# Patient Record
Sex: Female | Born: 1940
Health system: Southern US, Community
[De-identification: ages and names within clinical notes are randomized; demographics above are authoritative.]

## PROBLEM LIST (undated history)

## (undated) DIAGNOSIS — M1990 Unspecified osteoarthritis, unspecified site: Secondary | ICD-10-CM

## (undated) DIAGNOSIS — R5381 Other malaise: Secondary | ICD-10-CM

## (undated) DIAGNOSIS — J4489 Other specified chronic obstructive pulmonary disease: Secondary | ICD-10-CM

## (undated) DIAGNOSIS — I10 Essential (primary) hypertension: Secondary | ICD-10-CM

## (undated) DIAGNOSIS — R0602 Shortness of breath: Secondary | ICD-10-CM

## (undated) DIAGNOSIS — J439 Emphysema, unspecified: Secondary | ICD-10-CM

## (undated) DIAGNOSIS — J449 Chronic obstructive pulmonary disease, unspecified: Secondary | ICD-10-CM

## (undated) DIAGNOSIS — I200 Unstable angina: Secondary | ICD-10-CM

## (undated) DIAGNOSIS — F17200 Nicotine dependence, unspecified, uncomplicated: Secondary | ICD-10-CM

## (undated) DIAGNOSIS — R5383 Other fatigue: Secondary | ICD-10-CM

## (undated) DIAGNOSIS — E039 Hypothyroidism, unspecified: Secondary | ICD-10-CM

## (undated) DIAGNOSIS — I519 Heart disease, unspecified: Secondary | ICD-10-CM

## (undated) DIAGNOSIS — I739 Peripheral vascular disease, unspecified: Secondary | ICD-10-CM

## (undated) DIAGNOSIS — I2510 Atherosclerotic heart disease of native coronary artery without angina pectoris: Secondary | ICD-10-CM

## (undated) DIAGNOSIS — F411 Generalized anxiety disorder: Secondary | ICD-10-CM

## (undated) DIAGNOSIS — E785 Hyperlipidemia, unspecified: Secondary | ICD-10-CM

## (undated) DIAGNOSIS — R079 Chest pain, unspecified: Secondary | ICD-10-CM

## (undated) DIAGNOSIS — R002 Palpitations: Secondary | ICD-10-CM

## (undated) HISTORY — DX: Generalized anxiety disorder: F41.1

## (undated) HISTORY — DX: Peripheral vascular disease, unspecified: I73.9

## (undated) HISTORY — DX: Unstable angina: I20.0

## (undated) HISTORY — DX: Hyperlipidemia, unspecified: E78.5

## (undated) HISTORY — DX: Atherosclerotic heart disease of native coronary artery without angina pectoris: I25.10

## (undated) HISTORY — DX: Shortness of breath: R06.02

## (undated) HISTORY — DX: Nicotine dependence, unspecified, uncomplicated: F17.200

## (undated) HISTORY — DX: Other fatigue: R53.83

## (undated) HISTORY — PX: APPENDECTOMY: SHX54

## (undated) HISTORY — DX: Other specified chronic obstructive pulmonary disease: J44.89

## (undated) HISTORY — PX: ABDOMINAL HYSTERECTOMY: SHX81

## (undated) HISTORY — DX: Chest pain, unspecified: R07.9

## (undated) HISTORY — DX: Other malaise: R53.81

## (undated) HISTORY — DX: Palpitations: R00.2

## (undated) HISTORY — DX: Chronic obstructive pulmonary disease, unspecified: J44.9

## (undated) HISTORY — DX: Essential (primary) hypertension: I10

## (undated) HISTORY — DX: Emphysema, unspecified: J43.9

## (undated) HISTORY — DX: Heart disease, unspecified: I51.9

---

## 2006-02-11 DIAGNOSIS — I2510 Atherosclerotic heart disease of native coronary artery without angina pectoris: Secondary | ICD-10-CM

## 2006-02-11 HISTORY — DX: Atherosclerotic heart disease of native coronary artery without angina pectoris: I25.10

## 2006-09-30 ENCOUNTER — Encounter: Payer: Self-pay | Admitting: Physician Assistant

## 2006-09-30 ENCOUNTER — Ambulatory Visit: Payer: Self-pay | Admitting: Cardiology

## 2006-10-01 ENCOUNTER — Inpatient Hospital Stay (HOSPITAL_COMMUNITY): Admission: AD | Admit: 2006-10-01 | Discharge: 2006-10-03 | Payer: Self-pay | Admitting: Internal Medicine

## 2006-10-01 ENCOUNTER — Ambulatory Visit: Payer: Self-pay | Admitting: Cardiology

## 2006-10-02 ENCOUNTER — Encounter: Payer: Self-pay | Admitting: Cardiology

## 2006-10-20 ENCOUNTER — Ambulatory Visit: Payer: Self-pay | Admitting: Physician Assistant

## 2007-12-10 ENCOUNTER — Ambulatory Visit (HOSPITAL_COMMUNITY): Admission: RE | Admit: 2007-12-10 | Discharge: 2007-12-10 | Payer: Self-pay | Admitting: Otolaryngology

## 2009-11-21 ENCOUNTER — Encounter (INDEPENDENT_AMBULATORY_CARE_PROVIDER_SITE_OTHER): Payer: Self-pay | Admitting: *Deleted

## 2009-11-21 ENCOUNTER — Ambulatory Visit: Payer: Self-pay | Admitting: Cardiology

## 2009-11-21 DIAGNOSIS — E785 Hyperlipidemia, unspecified: Secondary | ICD-10-CM

## 2009-11-21 DIAGNOSIS — I739 Peripheral vascular disease, unspecified: Secondary | ICD-10-CM | POA: Insufficient documentation

## 2009-11-21 DIAGNOSIS — R079 Chest pain, unspecified: Secondary | ICD-10-CM | POA: Insufficient documentation

## 2009-11-21 DIAGNOSIS — F17200 Nicotine dependence, unspecified, uncomplicated: Secondary | ICD-10-CM

## 2009-11-23 ENCOUNTER — Encounter: Payer: Self-pay | Admitting: Cardiology

## 2009-11-24 ENCOUNTER — Inpatient Hospital Stay (HOSPITAL_BASED_OUTPATIENT_CLINIC_OR_DEPARTMENT_OTHER): Admission: RE | Admit: 2009-11-24 | Discharge: 2009-11-24 | Payer: Self-pay | Admitting: Cardiology

## 2009-11-24 ENCOUNTER — Ambulatory Visit: Payer: Self-pay | Admitting: Cardiology

## 2009-11-29 ENCOUNTER — Encounter (INDEPENDENT_AMBULATORY_CARE_PROVIDER_SITE_OTHER): Payer: Self-pay | Admitting: *Deleted

## 2009-12-06 ENCOUNTER — Ambulatory Visit: Payer: Self-pay | Admitting: Cardiology

## 2009-12-06 DIAGNOSIS — J069 Acute upper respiratory infection, unspecified: Secondary | ICD-10-CM | POA: Insufficient documentation

## 2009-12-15 ENCOUNTER — Encounter: Payer: Self-pay | Admitting: Cardiology

## 2009-12-19 ENCOUNTER — Encounter (INDEPENDENT_AMBULATORY_CARE_PROVIDER_SITE_OTHER): Payer: Self-pay | Admitting: *Deleted

## 2010-03-13 NOTE — Letter (Signed)
Summary: Engineer, materials at [facility]  [address]   [place], [place] [address]   Phone: [phone]  Fax: [phone]        December 19, 2009 MRN: [id number]   SHARMON CHERAMIE [address] [place], [place]  [address]   Dear Ms. Ethelle Lyon,  Your test ordered by Selena Batten has been reviewed by your physician (or physician assistant) and was found to be normal or stable. Your physician (or physician assistant) felt no changes were needed at this time.  ____ Echocardiogram  ____ Cardiac Stress Test  ____ Lab Work  __X__ Peripheral vascular study of arms, legs or neck  ____ CT scan or X-ray  ____ Lung or Breathing test  ____ Other:   Thank you.   Hoover Brunette, LPN    Duane Boston, M.D., F.A.C.C. Thressa Sheller, M.D., F.A.C.C. Oneal Grout, M.D., F.A.C.C. Cheree Ditto, M.D., F.A.C.C. Daiva Nakayama, M.D., F.A.C.C. Kenney Houseman, M.D., F.A.C.C. Jeanne Ivan, PA-C

## 2010-03-13 NOTE — Cardiovascular Report (Signed)
Summary: Cardiac Cath Dr. Riley Kill  Cardiac Cath Dr. Riley Kill   Imported By: Zachary George 11/21/2009 13:19:18  _____________________________________________________________________  External Attachment:    Type:   Image     Comment:   External Document

## 2010-03-13 NOTE — Assessment & Plan Note (Signed)
Summary: OV per anwar - poss cath  --agh   Visit Type:  Initial Consult Primary Provider:  Linna Darner  CC:  chest pain.  History of Present Illness: the patient is a 70 year old female with history of nonobstructive coronary artery disease last catheterization in 2008 by Dr. Riley Kill. The patient has multiple risk factors and tobacco use, hypertension dyslipidemia and family history of coronary artery disease.  The patient has been referred by Dr. Linna Darner for new-onset exertional chest pain. The patient states over the last several weeks she has experienced heaviness in the chest as well as heaviness in both arms particularly walking up hill. She also develops calf pain upon walking with a sensation of tightness necessitating to stop her activities. She also symptoms of reflux or heartburn is distinct and different from her current symptoms. Associated with chest heaviness for the last several minutes there is short of breath. She denies any palpitations. She was given Imdur by her primary care physician with moderate improvement in her symptoms. She also spironolactone which has not used. There was concern due to last office visit in 2008 at the patient had peripheral vascular disease but did not get an aortogram of time for cardiac catheterization. Unfortunately also the patient used to smoke but she has been compliant with her other medical regimen. Her EKG today shows no acute ischemic changes. There is an old septal infarct pattern but this is noted on prior electrocardiograms.  The patient has been referred by primary care physician and a question of possible need for cardiac catheterization.  Preventive Screening-Counseling & Management  Alcohol-Tobacco     Smoking Status: current     Smoking Cessation Counseling: yes     Packs/Day: 1/2 PPD  Current Medications (verified): 1)  Coq-10 100 Mg Caps (Coenzyme Q10) .... Take 1 Tablet By Mouth Once A Day 2)  Aspirin 325 Mg Tabs (Aspirin) .... Take 1  Tablet By Mouth Once A Day 3)  Zyrtec Allergy 10 Mg Tabs (Cetirizine Hcl) .... Take 1 Tablet By Mouth Once A Day 4)  Metoprolol Succinate 50 Mg Xr24h-Tab (Metoprolol Succinate) .... Take 1 Tablet By Mouth Once A Day 5)  Caltrate 600+d 600-400 Mg-Unit Tabs (Calcium Carbonate-Vitamin D) .... Take 2 Tablet By Mouth Once A Day 6)  Vitamin D3 1000 Unit Caps (Cholecalciferol) .... Take 1 Tablet By Mouth Once A Day 7)  Imdur 60 Mg Xr24h-Tab (Isosorbide Mononitrate) .... Take 1 Tablet By Mouth Once A Day 8)  Trilipix 135 Mg Cpdr (Choline Fenofibrate) .... Take 1 Tablet By Mouth Once A Day 9)  Red Yeast Rice 600 Mg Caps (Red Yeast Rice Extract) .... Take 2 Tablet By Mouth Once A Day  Allergies (verified): 1)  ! * Celdene  Comments:  Nurse/Medical Assistant: The patient's medication list and allergies were reviewed with the patient and were updated in the Medication and Allergy Lists.  Past History:  Past Medical History:  1. Nonobstructive coronary artery disease byby cardiac catheterization in 2008 2. Good left ventricular function. 3. Dyslipidemia. 4. Hypertension. 5. Ex-smoker. a. 30-pack-year history. 6. Palpitations. 7. Left lower extremity exertional pain. a. Rule out claudication. 8. Family history of coronary artery disease. 9. Chest pain.   Past Surgical History: appendectomy  Family History: Reviewed history and no changes required.  sister age 79 status post myocardial infarction at age 30  Social History: Reviewed history and no changes required. patient lives in [place] she is the mother Efrain Sella, who used to work in our office. Chest children  and 10 grandchildren. She is retired from [employer]. Shouldn't smoking tobacco since age 73. Denies alcohol use.Smoking Status:  current Packs/Day:  1/2 PPD  Review of Systems       The patient complains of chest pain and shortness of breath.  The patient denies fatigue, malaise, fever, weight gain/loss, vision loss, decreased  hearing, hoarseness, palpitations, prolonged cough, wheezing, sleep apnea, coughing up blood, abdominal pain, blood in stool, nausea, vomiting, diarrhea, heartburn, incontinence, blood in urine, muscle weakness, joint pain, leg swelling, rash, skin lesions, headache, fainting, dizziness, depression, anxiety, enlarged lymph nodes, easy bruising or bleeding, and environmental allergies.    Vital Signs:  Patient profile:   69 year old female Height:      60 inches Weight:      135 pounds BMI:     26.46 Pulse rate:   79 / minute BP sitting:   160 / 89  (left arm) Cuff size:   regular  Vitals Entered By: Carlye Grippe (November 21, 2009 1:20 PM)  Nutrition Counseling: Patient's BMI is greater than 25 and therefore counseled on weight management options. CC: chest pain   Physical Exam  Additional Exam:  General: Well-developed, well-nourished in no distress head: Normocephalic and atraumatic eyes PERRLA/EOMI intact, conjunctiva and lids normal nose: No deformity or lesions mouth normal dentition, normal posterior pharynx neck: Supple, no JVD.  No masses, thyromegaly or abnormal cervical nodes lungs: Normal breath sounds bilaterally without wheezing.  Normal percussion heart: regular rate and rhythm with normal S1 and S2, no S3 or S4.  PMI is normal.  No pathological murmurs abdomen: Normal bowel sounds, abdomen is soft and nontender without masses, organomegaly or hernias noted.  No hepatosplenomegaly. Right lower quadrant scar is noted from prior appendectomy musculoskeletal: Back normal, normal gait muscle strength and tone normal pulsus: femoral pulses are 1+ bilaterally with bilateral bruits dorsalis pedis and posterior tibial pulses are 2+ bilaterally Extremities: No peripheral pitting edema neurologic: Alert and oriented x 3 skin: Intact without lesions or rashes cervical nodes: No significant adenopathy psychologic: Normal affect    EKG  Procedure date:   11/21/2009  Findings:      normal sinus rhythm septal infarct pattern age undetermined. Nonspecific ST-T wave changes. Heart rate 85 beats per minute.  Impression & Recommendations:  Problem # 1:  CHEST PAIN (ICD-786.50) the patient's chest pain is concerning for angina. Although she had nonobstructive disease in 2008 she has ongoing untreated risk factors. In particularlyshe continues to smoke heavily. We will increase isosorbide mononitrate 60 mg a day to schedule her for a diagnostic catheterization later this week.I discussed with the patient the risk and benefits of diagnostic cardiac catheterization as well as a distal aortogram she is willing to proceed. She denies any dye allergies and had no prior complications with her catheterization. Her updated medication list for this problem includes:    Aspirin 325 Mg Tabs (Aspirin) .Marland Kitchen... Take 1 tablet by mouth once a day    Metoprolol Succinate 50 Mg Xr24h-tab (Metoprolol succinate) .Marland Kitchen... Take 1 tablet by mouth once a day    Imdur 60 Mg Xr24h-tab (Isosorbide mononitrate) .Marland Kitchen... Take 1 tablet by mouth once a day  Orders: EKG w/ Interpretation (93000) T-Basic Metabolic Panel ([id number]) T-CBC No Diff ([id number]) T-Protime, Auto ([id number]) T-PTT ([id number]) T-Chest x-ray, 2 views (28413) Cardiac Catheterization (Cardiac Cath)  Problem # 2:  CLAUDICATION (ICD-443.9) the patient's symptoms are concerning for claudication however she has good palpable pulses in the lower extremities but she does  have bilateral femoral bruits. She can be screened for iliofemoral disease during her cardiac catheterization. If negative I doubt ABIs are needed as her posterior tibial pulses and dorsalis pedis pulse are quite palpable.  Problem # 3:  TOBACCO ABUSE (ICD-305.1) the patient did start Nicoderm after her cardiac catheterization which he already has purchased and she is now more motivated to discontinue tobacco  Problem # 4:  OTHER AND  UNSPECIFIED HYPERLIPIDEMIA (ICD-272.4)  the patient has hypertriglyceridemia and also will need a lipid panel and could benefit from statin drug therapy. Her updated medication list for this problem includes:    Trilipix 135 Mg Cpdr (Choline fenofibrate) .Marland Kitchen... Take 1 tablet by mouth once a day  Orders: T-Basic Metabolic Panel [id number]) T-CBC No Diff ([id number]) T-Protime, Auto ([id number]) T-PTT ([id number]) T-Chest x-ray, 2 views (28413)  Patient Instructions: 1)  Left JV Cath 2)  Increase Imdur to 60mg  daily 3)  Follow up in  1 month

## 2010-03-13 NOTE — Letter (Signed)
Summary: Cardiac Cath Instructions - [facility]  [facility] [address]   [place], [place] [address]   Phone: [phone]  Fax: [phone]     11/21/2009 MRN: [id number]  Linda Sherman [address] [place], [place]  [address]  Dear [name],   You are scheduled for a Cardiac Catheterization on Friday, October 14 at 10:30 with Dr. Antoine Poche.  Please arrive to the 1st floor of the Heart and Vascular Center at [facility] at 9:30 am on the day of your procedure. Please do not arrive before 6:30 a.m. Call the Heart and Vascular Center at [phone] if you are unable to make your appointmnet. The Code to get into the parking garage under the building is 0200. Take the elevators to the 1st floor. You must have someone to drive you home. Someone must be with you for the first 24 hours after you arrive home. Please wear clothes that are easy to get on and off and wear slip-on shoes. Do not eat or drink after midnight except water with your medications that morning. Bring all your medications and current insurance cards with you.  ___ DO NOT take these medications before your procedure:  _X__ Make sure you take your aspirin.  _X__ You may take ALL of your medications with water that morning.  ___ DO NOT take ANY medications before your procedure.  ___ Pre-med instructions:  ________________________________________________________________________  The usual length of stay after your procedure is 2 to 3 hours. This can vary.  If you have any questions, please call the office at the number listed above.  Hoover Brunette, LPN                    Directions to the [facility]  Please Note : Park in [place] under the building not the parking deck.  From [employer]: Turn onto [address] Left onto [address] (1st stoplight) Right at the brick entrance to the hospital ([address] circle [address]) Bear to  the right and you will see a blue sign "Heart and Vascular Center" Parking garage is a sharp right'to get through the gate out in the code _______. Once you park, take the elevator to the first floor. Please do not arrive before 0630am. The building will be dark before that time.   From [address] Turn onto [address] Turn left into the brick entrance to the hospital (Main circle drive) Bear to the right and you will see a blue sign "[facility] V[facility] Center" Parking garage is a sharp right, to get thru the gate put in the code ____. Once you park, take the elevator to the first floor. Please do not arrive before 0630am. The building will be dark before that time

## 2010-03-13 NOTE — Letter (Signed)
Summary: Engineer, materials at [facility]  [address]   [place], [place] [address]   Phone: [phone]  Fax: [phone]        November 29, 2009 MRN: [id number]   Linda Sherman [address] [place], [place]  [address]   Dear Ms. Linda Sherman,  Your test ordered by Selena Batten has been reviewed by your physician (or physician assistant) and was found to be normal or stable. Your physician (or physician assistant) felt no changes were needed at this time.  ____ Echocardiogram  ____ Cardiac Stress Test  __X__ Lab Work  ____ Peripheral vascular study of arms, legs or neck  __X__ CT scan or X-ray - chest  ____ Lung or Breathing test  ____ Other:   Thank you.   Hoover Brunette, LPN    Duane Boston, M.D., F.A.C.C. Thressa Sheller, M.D., F.A.C.C. Oneal Grout, M.D., F.A.C.C. Cheree Ditto, M.D., F.A.C.C. Daiva Nakayama, M.D., F.A.C.C. Kenney Houseman, M.D., F.A.C.C. Jeanne Ivan, PA-C

## 2010-03-13 NOTE — Letter (Signed)
Summary: Internal Correspondence/ FAXED PRE-CATH ORDER  Internal Correspondence/ FAXED PRE-CATH ORDER   Imported By: Dorise Hiss 12/12/2009 16:48:56  _____________________________________________________________________  External Attachment:    Type:   Image     Comment:   External Document

## 2010-03-13 NOTE — Consult Note (Signed)
Summary: CARDIOLOGY CONSULT/ [facility]  CARDIOLOGY CONSULT/ [facility]   Imported By: Zachary George 11/21/2009 13:19:53  _____________________________________________________________________  External Attachment:    Type:   Image     Comment:   External Document

## 2010-03-13 NOTE — Assessment & Plan Note (Signed)
Summary: EPH-POST CATH   Visit Type:  Follow-up Primary Provider:  Linna Darner   History of Present Illness: Linda Sherman is a 70 year old female with history of nonobstructive coronary artery disease. She was recently seen in consultation for increased chest pain with heart heaviness. She was referred for cardiac catheterization.she was found to have nonobstructive disease but has diffuse coronary artery plaquing.She has  25% left main stenosis and a 70% ostial stenosis Linda first diagonal. There was also 50% stenosis of Linda second diagonal. Note was marginal branch of Linda long 60% stenosis. Her ejection fraction was 65%. Interestingly Linda Sherman's symptoms have improved after she was given in order. She does not work any more chest heaviness or arm heaviness. She did not undergo intervention. She denies any shortness of breath orthopnea PND.  Linda Sherman reports cough and sinus drainage she states she feels miserable. She is hoarse. She reports no fever or chills. She is requesting if I can provide her with an antibiotic.  Linda Sherman reports pain in both calves when walking uphill. She has to rest to make Linda pain go away. Interestingly however physical exam she has good peripheral pulses.  Preventive Screening-Counseling & Management  Alcohol-Tobacco     Smoking Status: current     Smoking Cessation Counseling: yes     Packs/Day: >1/2 PPD  Current Medications (verified): 1)  Coq-10 100 Mg Caps (Coenzyme Q10) .... Take 1 Tablet By Mouth Once A Day 2)  Aspirin 325 Mg Tabs (Aspirin) .... Take 1 Tablet By Mouth Once A Day 3)  Zyrtec Allergy 10 Mg Tabs (Cetirizine Hcl) .... Take 1 Tablet By Mouth Once A Day 4)  Metoprolol Succinate 50 Mg Xr24h-Tab (Metoprolol Succinate) .... Take 1 Tablet By Mouth Once A Day 5)  Caltrate 600+d 600-400 Mg-Unit Tabs (Calcium Carbonate-Vitamin D) .... Take 2 Tablet By Mouth Once A Day 6)  Imdur 60 Mg Xr24h-Tab (Isosorbide Mononitrate) .... Take 1 1/2 Tab (90mg )  Daily 7)  Trilipix 135 Mg Cpdr (Choline Fenofibrate) .... Take 1 Tablet By Mouth Once A Day 8)  Red Yeast Rice 600 Mg Caps (Red Yeast Rice Extract) .... Take 2 Tablet By Mouth Once A Day 9)  Mucinex 600 Mg Xr12h-Tab (Guaifenesin) .... Take 1-2 Tabs Two Times A Day With Plenty of Water 10)  Smz-Tmp Ds 800-160 Mg Tabs (Sulfamethoxazole-Trimethoprim) .... Take 1 Tablet By Mouth Two Times A Day X 7 Days  Allergies (verified): 1)  ! * Celdene  Comments:  Nurse/Medical Assistant: Linda Sherman's medication list and allergies were reviewed with Linda Sherman and were updated in Linda Medication and Allergy Lists.  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 Chest children and 10 grandchildren. She is retired from [employer]. Shouldn't smoking tobacco since age 70. Denies alcohol use.  Risk Factors: Smoking Status: current (12/06/2009) Packs/Day: >1/2 PPD (12/06/2009)  Past Medical History:  1. Nonobstructive coronary artery disease byby cardiac catheterization in 2008 2. Good left ventricular function. 3. Dyslipidemia. 4. Hypertension. 5. Ex-smoker. a. 30-pack-year history. 6. Palpitations. 7. Left lower extremity exertional pain. a. Rule out claudication. 8. Family history of coronary artery disease. 9. Chest pain.  Repeat cardiac catheterization September 2000 Linda LAD and nonobstructive coronary artery disease with an ejection fraction of 65%  Social  History: Packs/Day:  >1/2 PPD  Review of Systems       Linda Sherman complains of malaise and prolonged cough.  Linda Sherman denies fatigue, fever, weight gain/loss, vision loss, decreased hearing, hoarseness, chest pain, palpitations, shortness of breath, wheezing, sleep apnea,  coughing up blood, abdominal pain, blood in stool, nausea, vomiting, diarrhea, heartburn, incontinence, blood in urine, muscle weakness, joint pain, leg swelling, rash, skin lesions, headache, fainting, dizziness, depression, anxiety, enlarged lymph nodes, easy bruising or bleeding, and environmental allergies.    Vital Signs:  Sherman profile:   70 year old female Height:      60 inches Weight:      135 pounds O2 Sat:      99 % on Room air Pulse rate:   97 / minute BP sitting:   136 / 81  (left arm) Cuff size:   regular  Vitals Entered By: Carlye Grippe (December 06, 2009 10:32 AM)  O2 Flow:  Room air  Physical Exam  Additional Exam:  General: Well-developed, well-nourished in no distress head: Normocephalic and atraumatic eyes PERRLA/EOMI intact, conjunctiva and lids normal nose: No deformity or lesions mouth normal dentition, normal posterior pharynx neck: Supple, no JVD.  No masses, thyromegaly or abnormal cervical nodes lungs: Normal breath sounds bilaterally without wheezing.  Normal percussion heart: regular rate and rhythm with normal S1 and S2, no S3 or S4.  PMI is normal.  No pathological murmurs abdomen: Normal bowel sounds, abdomen is soft and nontender without masses, organomegaly or hernias noted.  No hepatosplenomegaly. Right lower quadrant scar is noted from prior appendectomy musculoskeletal: Back normal, normal gait muscle strength and tone normal pulsus: femoral pulses are 1+ bilaterally with bilateral bruits dorsalis pedis and posterior tibial pulses are 2+ bilaterally Extremities: No peripheral pitting edema neurologic: Alert and oriented x 3 skin: Intact without lesions or rashes cervical nodes: No significant adenopathy psychologic: Normal affect    Impression & Recommendations:  Problem # 1:  CLAUDICATION (ICD-443.9) despite Linda Sherman having good pulses on exam her symptoms are concerning for claudication and I will order ABIs.  Problem # 2:  URI  (ICD-465.9)  I recommend that to Linda Sherman to take guaifenesin ER one to 2 tablets p.o. q.12 hours with plenty of water and I also prescribed trimethoprim/sulfamethoxazole double strength one tablet p.o. b.i.d. x7 days. She reports no sulfa allergy.  Her updated medication list for this problem includes:    Smz-tmp Ds 800-160 Mg Tabs (Sulfamethoxazole-trimethoprim) .Marland Kitchen... Take 1 tablet by mouth two times a day x 7 days  Problem # 3:  TOBACCO ABUSE (ICD-305.1) Linda Sherman was counseled regarding this.  Problem # 4:  CHEST PAIN (ICD-786.50) arm heaviness and chest pain has improved with indoor. She has nonobstructive coronary arteries,  albeit with significant diffuse coronary plaquing and Linda Sherman is certainly and remains at high risk for future MI and needs aggressive risk factor modification. I also increased her Imdur  to 90 mg p.o. q. daily Her updated medication list for this problem includes:    Aspirin 325 Mg Tabs (Aspirin) .Marland Kitchen... Take 1 tablet by mouth once a day    Metoprolol Succinate 50 Mg Xr24h-tab (Metoprolol succinate) .Marland Kitchen... Take 1 tablet by mouth once a day    Imdur 60 Mg Xr24h-tab (Isosorbide mononitrate) .Marland Kitchen... Take 1 1/2 tab (90mg ) daily  Other Orders: ABI (ABI)  Sherman Instructions: 1)  Guaifenisin ER  2)  TMP/SMX DS x 7 days  3)  ABI 4)  Increase Imdur 90mg   daily 5)  Follow up in  6 months Prescriptions: SMZ-TMP DS 800-160 MG TABS (SULFAMETHOXAZOLE-TRIMETHOPRIM) Take 1 tablet by mouth two times a day x 7 days  #14 x 0   Entered by:   Hoover Brunette, LPN   Authorized by:   Lewayne Bunting, MD, [facility]   Signed by:   Hoover Brunette, LPN on 16/11/9602   Method used:   Electronically to        [facility]  [address] [id number] (retail)       [address]       [place], [place]  [address]       Ph: [phone] or [phone]       Fax: [phone]   RxID:   [id number] MUCINEX 600 MG XR12H-TAB (GUAIFENESIN) take 1-2 tabs two times a day with plenty of water   #30 x 1   Entered by:   Hoover Brunette, LPN   Authorized by:   Lewayne Bunting, MD, [facility]   Signed by:   Hoover Brunette, LPN on 40/34/7425   Method used:   Electronically to        [facility]  [address] [id number] (retail)       [address]       [place], [place]  [address]       Ph: [phone] or [phone]       Fax: [phone]   RxID:   [id number] IMDUR 60 MG XR24H-TAB (ISOSORBIDE MONONITRATE) take 1 1/2 tab (90mg ) daily  #45 x 6   Entered by:   Hoover Brunette, LPN   Authorized by:   Lewayne Bunting, MD, [facility]   Signed by:   Hoover Brunette, LPN on 62/83/1517   Method used:   [facility] to        [facility]  [address] [id number]ail[facility]       [address]       [place], [place]  [address]       Ph: [phone] or [phone]       Fax: [phone]   RxID:   [id number]

## 2010-06-06 ENCOUNTER — Encounter: Payer: Self-pay | Admitting: *Deleted

## 2010-06-07 ENCOUNTER — Encounter: Payer: Self-pay | Admitting: Cardiology

## 2010-06-07 ENCOUNTER — Ambulatory Visit (INDEPENDENT_AMBULATORY_CARE_PROVIDER_SITE_OTHER): Payer: Medicare Other | Admitting: Cardiology

## 2010-06-07 VITALS — BP 163/91 | HR 76 | Ht 60.0 in | Wt 132.0 lb

## 2010-06-07 DIAGNOSIS — F17200 Nicotine dependence, unspecified, uncomplicated: Secondary | ICD-10-CM

## 2010-06-07 DIAGNOSIS — E782 Mixed hyperlipidemia: Secondary | ICD-10-CM | POA: Insufficient documentation

## 2010-06-07 DIAGNOSIS — I739 Peripheral vascular disease, unspecified: Secondary | ICD-10-CM

## 2010-06-07 DIAGNOSIS — E785 Hyperlipidemia, unspecified: Secondary | ICD-10-CM

## 2010-06-07 DIAGNOSIS — I2510 Atherosclerotic heart disease of native coronary artery without angina pectoris: Secondary | ICD-10-CM

## 2010-06-07 NOTE — Progress Notes (Signed)
HPI The patient is a 70 year old female with a history of nonobstructive coronary artery disease. She was referred last year for cardiac catheterization was found to have nonobstructive disease but with diffuse coronary plaquing. She has a 25% left main stenosis and a 70% ostial stenosis of the first diagonal. She also is a 50% stenosis of the second diagonal. There was a marginal branch with a long 60% stenosis. She has an ejection fraction of 65% she did not going undergoing intervention. She also normal ABIs in the past. A catheterization was performed in November 2011. She was also found to have nonobstructive peripheral vascular disease. The patient has been doing well from a cardiovascular perspective. She has been very concerned about her triglycerides and cholesterol. This is followed by primary care physician. Unfortunately she cannot tolerate any of the statins. She also has been tried on Trilipix but had significant reflux with this. She had recent blood work done but I do not have those results available. I do note that her triglycerides have been greater than 500.  No Known Allergies  Current Outpatient Prescriptions on File Prior to Visit  Medication Sig Dispense Refill  . aspirin 325 MG tablet Take 325 mg by mouth daily.        . Calcium Carbonate-Vitamin D (CALCIUM 600-D) 600-400 MG-UNIT per tablet Take 2 tablets by mouth daily.        . cetirizine (ZYRTEC) 10 MG tablet Take 10 mg by mouth daily.        . Coenzyme Q10 (CO Q 10) 10 MG CAPS Take 1 capsule by mouth daily.        Marland Kitchen guaiFENesin (MUCINEX) 600 MG 12 hr tablet Take 1-2 tablets by mouth two times a day with plenty of water.       . isosorbide mononitrate (IMDUR) 60 MG 24 hr tablet Take 90 mg by mouth daily. (One and one-half tablets)       . metoprolol (TOPROL-XL) 50 MG 24 hr tablet Take 50 mg by mouth daily.        Marland Kitchen DISCONTD: Choline Fenofibrate 135 MG capsule Take 135 mg by mouth daily.        Marland Kitchen DISCONTD: Red Yeast Rice  600 MG TABS Take 2 tablets by mouth daily.          Past Medical History  Diagnosis Date  . Coronary artery disease 2008    cardiac catheterization  . Left ventricular dysfunction with preserved left ventricular function   . Dyslipidemia   . Unspecified essential hypertension   . Palpitations   . Chest pain, unspecified   . Tobacco use disorder   . Peripheral vascular disease, unspecified   . Anxiety state, unspecified   . Other malaise and fatigue   . Shortness of breath   . Chronic airway obstruction, not elsewhere classified   . Intermediate coronary syndrome     Past Surgical History  Procedure Date  . Appendectomy   . Abdominal hysterectomy     Family History  Problem Relation Age of Onset  . Heart attack Sister 14    MI    History   Social History  . Marital Status: Married    Spouse Name: WAYNE    Number of Children: N/A  . Years of Education: N/A   Occupational History  . RETIRED     [employer]   Social History Main Topics  . Smoking status: Current Everyday Smoker -- 0.5 packs/day for 47 years    Types:  Cigarettes  . Smokeless tobacco: Not on file   Comment: Started smoking at age 77  . Alcohol Use: No  . Drug Use: No  . Sexually Active: Not on file   Other Topics Concern  . Not on file   Social History Narrative   patient lives in [place] she is the mother Efrain Sella, who used to work in our office   Review of systems:Pertinent positives as outlined above. The remainder of the 18  point review of systems is negative  PHYSICAL EXAM BP 163/91  Pulse 76  Ht 5' (1.524 m)  Wt 132 lb (59.875 kg)  BMI 25.78 kg/m2  General: Well-developed, well-nourished in no distress Head: Normocephalic and atraumatic Eyes:PERRLA/EOMI intact, conjunctiva and lids normal Ears: No deformity or lesions Mouth:normal dentition, normal posterior pharynx Neck: Supple, no JVD.  No masses, thyromegaly or abnormal cervical nodes Lungs: Normal breath sounds  bilaterally without wheezing.  Normal percussion Cardiac: regular rate and rhythm with normal S1 and S2, no S3 or S4.  PMI is normal.  No pathological murmurs Abdomen: Normal bowel sounds, abdomen is soft and nontender without masses, organomegaly or hernias noted.  No hepatosplenomegaly MSK: Back normal, normal gait muscle strength and tone normal Vascular: Pulse is normal in all 4 extremities Extremities: No peripheral pitting edema Neurologic: Alert and oriented x 3 Skin: Intact without lesions or rashes Lymphatics: No significant adenopathy Psychologic: Normal affect  ECG: Not available  ASSESSMENT AND PLAN

## 2010-06-07 NOTE — Assessment & Plan Note (Signed)
Patient continues to smoke and I recommended to call [phone]

## 2010-06-07 NOTE — Assessment & Plan Note (Signed)
Patient cannot tolerate statins. She also significant hypertriglyceridemia I recommended fish oil and decreasing her carbohydrate intake. However I told her that the most important thing she can do for herself to minimize her risk of future myocardial infarction and cardiovascular death to discontinue smoking

## 2010-06-07 NOTE — Assessment & Plan Note (Signed)
No symptoms of claudication 

## 2010-06-07 NOTE — Patient Instructions (Signed)
   Stop tobacco !  [phone] Your physician wants you to follow up in: 6 months.  You will receive a reminder letter in the mail one-two months in advance.  If you don't receive a letter, please call our office to schedule the follow up appointment

## 2010-06-26 NOTE — Assessment & Plan Note (Signed)
[facility]                          [facility] OFFICE NOTE   SUMMERS, BUENDIA                     MRN:          [id number]  DATE:10/20/2006                            DOB:          09-19-1940    CARDIOLOGIST:  She is new to Dr. Lewayne Bunting.   PRIMARY CARE PHYSICIAN:  Lia Hopping, M.D.   REASON FOR VISIT:  Post-hospitalization followup.   HISTORY OF PRESENT ILLNESS:  Linda Sherman is a 70 year old female patient  who was transferred to [facility] on October 01, 2006.  She was  seen for chest pain and exertional shortness of breath.  She ruled out  for myocardial infarction by enzymes.  She was transferred for cardiac  catheterization.  This was done by Dr. Riley Kill on October 02, 2006.  She  had mild-to-moderate nonobstructive coronary artery disease.  Specifically, she had a 30% proximal LAD stenosis, second and third  diagonals with ostial disease of about 40%, circumflex with 40% and then  30% narrowing distally, and an RCA with ostial 30% and 30-40% involving  the proximal PDA.  Her EF was normal.  She was treated medically and  discharged to home.   Of note, prior to transfer to the hospital, she was noted to have  femoral artery bruits on exam and complaints of left lower extremity  claudication.  She was to have a distal aortogram at the time of her  catheterization, but this was not performed.   In the office today she notes that she is doing okay.  She does have a  lot of questions.  She continues to note dyspnea on exertion.  She has  noted this probably for the last couple of months.  She describes NYHA  class II symptoms.  She denies any orthopnea, PND, or pedal edema.  She  did have somewhat of a cough.  She did quit smoking when she was  hospitalized.  The cough has decreased since then.  She denies any  wheezing.  She continues to have atypical chest pain.  It is usually  left-sided and very brief, lasting only a  second.  She has been  maintained on Protonix since her hospitalization.  She has had no more  episodes of chest pain that make her nauseated.   She has noted some increased heart rates and palpitations.  This has  been ongoing for quite some time.  She had it while she was  hospitalized.  According to the records, no significant arrhythmias were  noted.   She does note that she can tell that her blood pressure is elevated.  She is not sure why her atenolol/chlorthalidone was changed to  metoprolol.  However, she did feel better on the atenolol/chlorthalidone  and would like to switch back.   Since being hospitalized, she has noted frequent episodes of  diaphoresis.  She was not sure if this was related to her menopause or  some of the medications she has been taking.   CURRENT MEDICATIONS:  1. Aspirin 325 mg daily.  2. Metoprolol 25 mg b.i.d.  3. Crestor 10 mg  daily.  4. Protonix 40 mg daily.   ALLERGIES:  SELDANE.   SOCIAL HISTORY:  The patient has a 30-pack-year history of smoking.  She  just quit smoking within the last 3 weeks.   PHYSICAL EXAMINATION:  GENERAL:  She is a well-developed, well-nourished  female in no distress.  VITAL SIGNS:  Blood pressure 150/70, pulse 59, weight 127.4 pounds.  HEENT:  Normal.  NECK:  Without JVD.  CARDIAC:  S1 and S2.  Regular rate and rhythm without murmurs.  LUNGS:  Clear to auscultation bilaterally without wheezing, rhonchi, or  rales.  ABDOMEN:  Soft, nontender, with normoactive bowel sounds, no  organomegaly.  EXTREMITIES:  Without edema.  Calves are soft and nontender.  SKIN:  Warm and dry.  NEUROLOGIC:  She is alert and oriented x3.  Cranial nerves II-XII are  grossly intact.   Right femoral arteriotomy site without hematoma.  I cannot appreciate  any bruits.  There is no tenderness.   Electrocardiogram reveals sinus rhythm with a heart rate of 55.  Normal  axis.  No significant changes from previous tracing.    IMPRESSION:  1. Nonobstructive coronary artery disease by recent cardiac      catheterization.  2. Good left ventricular function.  3. Dyslipidemia.  4. Hypertension.  5. Ex-smoker.      a.     30-pack-year history.  6. Palpitations.  7. Left lower extremity exertional pain.      a.     Rule out claudication.  8. Family history of coronary artery disease.  9. Chest pain.   PLAN:  The patient presents to the office today for post-hospitalization  followup.  As noted above, she continues to have symptoms of dyspnea on  exertion and atypical chest pain.  She also notes palpitations.  She had  several questions about her medications and her diagnosis of  nonobstructive coronary artery disease.  I tried to answer all of her  questions today.  At this point in time, we plan to:  1. Proceed with ABIs and arterial Dopplers to rule out significant      peripheral arterial disease on the left.  2. Will discontinue her Lopressor and restart her      atenolol/chlorthalidone at 50/25 mg a day.  3. She has had some episodic diaphoresis.  We need to followup on her      renal function and potassium since she is going back on a diuretic.      Therefore, we will obtain some blood work in about 7-10 days to      include a BMET, CBC, TSH.  I will also check a BNP secondary to her      history of dyspnea on exertion.  I do not think she is having any      symptoms of diastolic dysfunction, but it would be important to      have a negative BNP in this regard.  4. I offered her a Cardio[employer] event monitor for a couple of weeks but      she declined.  I think this is reasonable.  She had no significant      arrhythmias noted according to the notes from the hospital.  At      this point in time, I would recommend switching her medicine as      noted above.  If she continues to have palpitations or worsening      symptoms between now and the time we see her back,  she is to call      [place] and we will set her  up for an event monitor.  Otherwise, at      followup we can reassess her symptoms and make a decision at that      time.  5. She will continue on aspirin.  6. She will continue on Crestor.  Dr. Olena Leatherwood has been providing her      with samples.  She will need followup lipids in 8 weeks.  Her goal      LDL is less than 70.  7. If she continues to have significant dyspnea on exertion at      followup, we will certainly need to refer her for pulmonary      function testing with DLCO.  I suspect her dyspnea on exertion is      probably related to her smoking, and she probably has underlying      COPD.  8. She will followup in 2-3 months with Dr. Andee Lineman or sooner p.r.n.      Linda Newcomer, Sherman-C  Electronically Signed      Learta Codding, MD,FACC  Electronically Signed   SW/MedQ  DD: 10/20/2006  DT: 10/20/2006  Job #: [id number]   cc:   Lia Hopping

## 2010-06-26 NOTE — Discharge Summary (Signed)
[name], [name]              ACCOUNT NO.:  [id number]   MEDICAL RECORD NO.:  [id number]          PATIENT TYPE:  INP   LOCATION:  3703                         FACILITY:  [facility]   PHYSICIAN:  Linda Morton. Riley Kill, MD, FACCDATE OF BIRTH:  10-30-1940   DATE OF ADMISSION:  10/01/2006  DATE OF DISCHARGE:  10/03/2006                               DISCHARGE SUMMARY   PRIMARY CARDIOLOGIST:  Dr. Lewayne Sherman.   PRIMARY Linda Sherman:  Dr. Olena Sherman in [place].   DISCHARGE DIAGNOSIS:  Chest pain.   SECONDARY DIAGNOSES:  1. Nonobstructive coronary artery disease.  2. Chronic obstructive pulmonary disease.  3. Ongoing tobacco abuse.  4. Hiatal hernia.  5. Osteopenia.  6. Status post hysterectomy.  7. Status post appendectomy.  8. Status post dilation and curettage.  9. Hypertension.  10.Hyperlipidemia.  11.A family history of coronary disease.   ALLERGIES:  SELDANE.   PROCEDURE:  Left heart catheterization.   HISTORY OF PRESENT ILLNESS:  A 70 year old female with the above problem  list who presented to [facility] on September 30, 2006 with  complaints of substernal chest tightness and fullness, associated with  nausea and worsened with exertion.  Symptoms have been ongoing for  approximately 2 weeks, but worsened suddenly the 2 days prior to  admission.  She was seen by Dr. Shara Sherman and Dr. Lewayne [facility] at [facility] and a decision was made to transfer her to [facility] for  further evaluation.  Notably, the patient ruled out for MI at [facility].   HOSPITAL COURSE:  Following arrival to [facility], Linda Sherman was taken  to the cath lab on August 21 where a left heart catheterization was  performed showing diffuse, nonobstructive coronary artery disease  throughout the coronary tree.  EF was normal.  The decision was made to  continue medical therapy, which includes aspirin, beta-blocker, statin  therapy.  She is being discharged home today in satisfactory condition.   DISCHARGE  LABS:  Hemoglobin 12.9, hematocrit 36.5, WBC 7.8, platelets  235.  Sodium 145, potassium 4.1, chloride 109, CO2 27, BUN 13,  creatinine 0.71, glucose 107.  Total cholesterol 251, triglycerides 246,  HDL 37, LDL 165.   DISPOSITION:  The patient is being discharged home today in good  condition.   FOLLOWUP PLANS AND APPOINTMENTS:  She is to follow up with Dr. Andee Sherman on  September 8 at 9:30 a.m.  She is asked to follow up with her primary  care [facility] Sherman, Dr. Olena Sherman, as previously scheduled.   DISCHARGE MEDICATIONS:  1. Aspirin 81 mg daily.  2. Lopressor 25 mg b.i.d.  3. Protonix 40 mg daily.  4. Crestor 10 mg daily.  5. Nitroglycerin 0.4 mg sublingual p.r.n. chest pain.   OUTSTANDING LAB STUDIES:  None.   DURATION OF DISCHARGE ENCOUNTER:  60 minutes, including physician time.      Linda Sherman, Linda Sherman      Linda Morton. Riley Kill, MD, [facility]  Electronically Signed    CB/MEDQ  D:  10/03/2006  T:  10/04/2006  Job:  [id number]   cc:   Linda Sherman

## 2010-06-26 NOTE — Cardiovascular Report (Signed)
[name], [name]              ACCOUNT NO.:  [id number]   MEDICAL RECORD NO.:  [id number]          PATIENT TYPE:  INP   LOCATION:  3703                         FACILITY:  [facility]   PHYSICIAN:  Arturo Morton. Riley Kill, MD, FACCDATE OF BIRTH:  [birth year]   DATE OF PROCEDURE:  10/02/2006  DATE OF DISCHARGE:                            CARDIAC CATHETERIZATION   INDICATIONS:  Ms. Linda Sherman is a 70 year old who presents with some chest  pain.  She has multiple cardiac risk factors.  The current study was  done to assess coronary anatomy.   PROCEDURE:  1. Left heart catheterization.  2. Selective coronary arteriography.  3. Selective left ventriculography.   DESCRIPTION OF PROCEDURE:  The patient was brought to the cath lab and  prepped and draped in the usual fashion.  Through an anterior puncture  the right femoral artery was entered; a 5-French sheath was placed.  Views of the left-and-right coronary artery was then obtained in  multiple angiographic projections.  Central aortic and left ventricular  pressures were measured with a pigtail.  Ventriculography was then  performed in the RAO projection.  We did a distal femoral angiogram even  though we were above the crease, the sheath inserted appeared to be just  at the bifurcation.  She was not closed.   She was taken to holding area in satisfactory clinical condition.   HEMODYNAMIC DATA:  1. Central a pressure 157/77, mean 109.  2. Left ventricular pressure 149/14.  3. No gradient pullback across aortic valve.   ANGIOGRAPHIC DATA:  1. Ventriculography in the RAO projection reveals vigorous global      systolic function.  2. There is general calcification of the left coronary system.  3. The left main coronary artery is free of critical disease.  4. The left anterior descending artery courses to the apex.  It      bifurcates into a major diagonal and LAD both of which were      somewhat small in caliber.  There is calcification  proximally.      There is about 30% narrowing throughout the whole proximal vessel,      although it does not appear to be high-grade.  There is clear-cut      evidence of plaque.  There is a second and third diagonals both of      which appear to have some ostial disease, difficult to grade, but      estimated about 40% the LAD just beyond the bifurcation also has      about 40% narrowing.  5. The circumflex provides a large tortuous marginal branch was some      calcification.  There is 40% narrowing and then 30% narrowing more      distally after it bifurcates as well.  6. The right coronary artery appears to have some very slight ostial      tapering then about 30% narrowing.  There is also 30% and 40%      narrowing of the bifurcation distally involving the proximal PDA;      and continuation branch, none of which appears to be  high-grade or      flow-limiting.   CONCLUSIONS:  1. Preserved left ventricular function.  2. Nonobstructive coronary plaquing throughout all 3 coronary arteries      as described in the above text.   RECOMMENDATIONS:  1. Discontinuation of smoking.  2. Aggressive lipid management with LDL less than 70.  3. Aspirin and possibly low-dose beta blockade for now.  4. Close follow up.      Arturo Morton. Riley Kill, MD, [facility]  Electronically Signed     TDS/MEDQ  D:  10/02/2006  T:  10/03/2006  Job:  [id number]   cc:   Cardiovascular Laboratory  Learta Codding, MD,FACC  Lia Hopping  Gene Serpe, PA-C

## 2010-09-01 IMAGING — CT CT ORBIT/TEMPORAL/IAC W/O CM
3 of 4 series · 17 of 30 positions shown, 19 images · IV contrast (agent unspecified)
Comparison: None

CLINICAL DATA: Right middle ear cholesteatoma.  Frequent ear
infections.  Partial hearing loss right year.

CT TEMPORAL BONES WITHOUT CONTRAST:
TECHNIQUE: Axial and coronal plane CT imaging of the petrous
temporal bones was performed with thin-collimation image
reconstruction.  No intravenous contrast was administered.
Multiplanar CT image recontructions were also generated.

[Series 2: bilateral · axial · 0.30mm/px · z∈[-158,-129]mm · 8 of 64 slices shown, 10 images]
[im 8/64  brain]
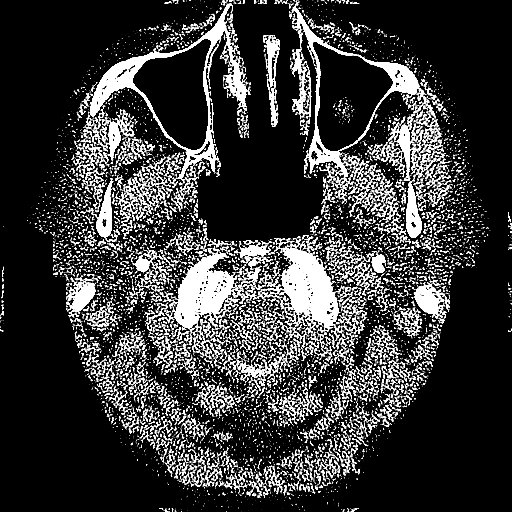
[im 8/64  bone]
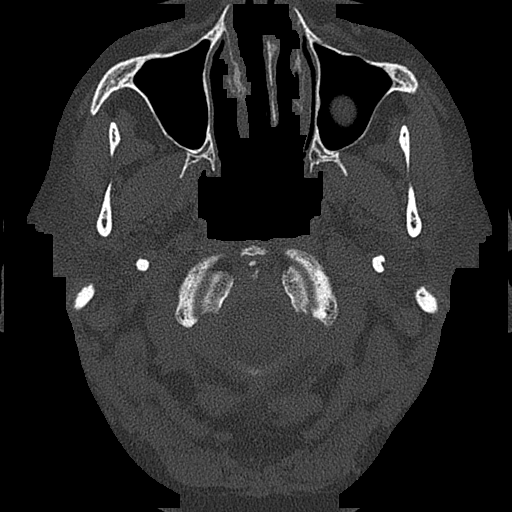
[im 15/64  bone]
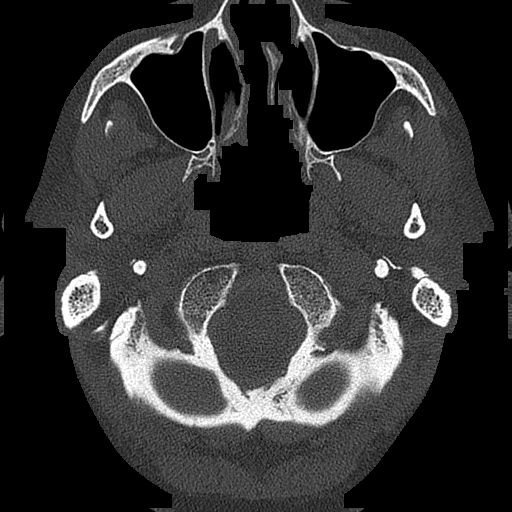
[im 22/64  bone]
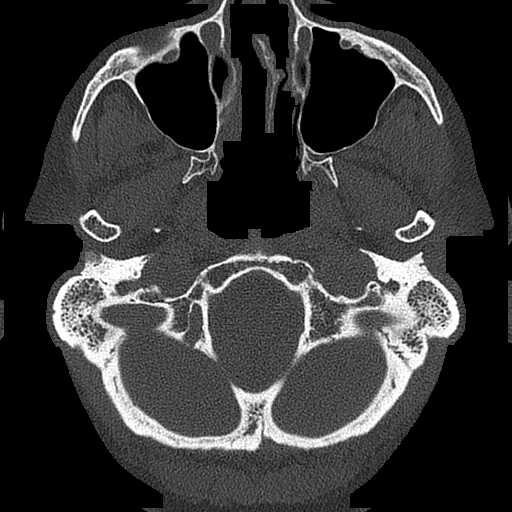
[im 29/64  bone]
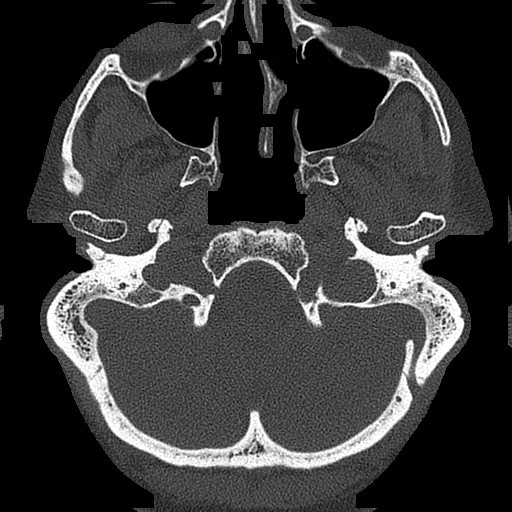
[im 36/64  brain]
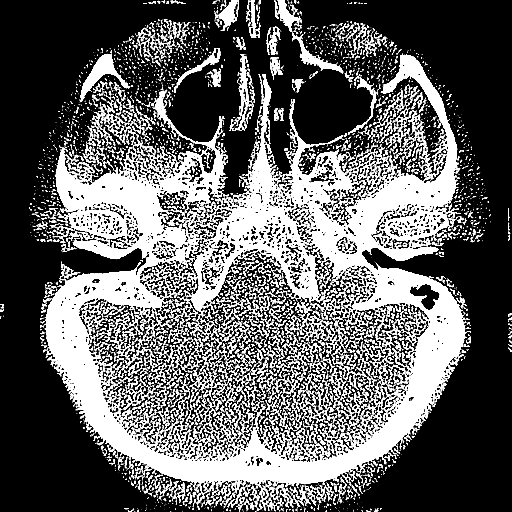
[im 36/64  bone]
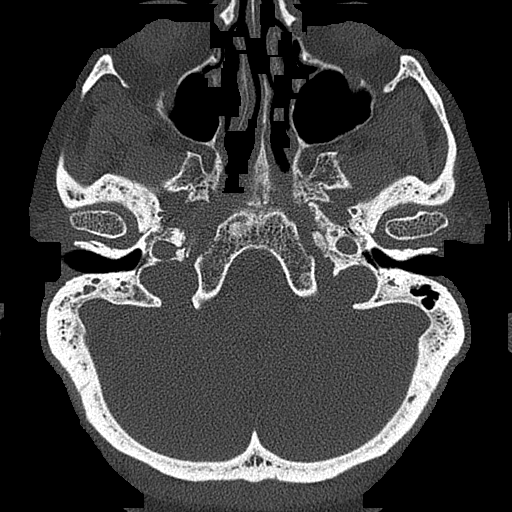
[im 43/64  bone]
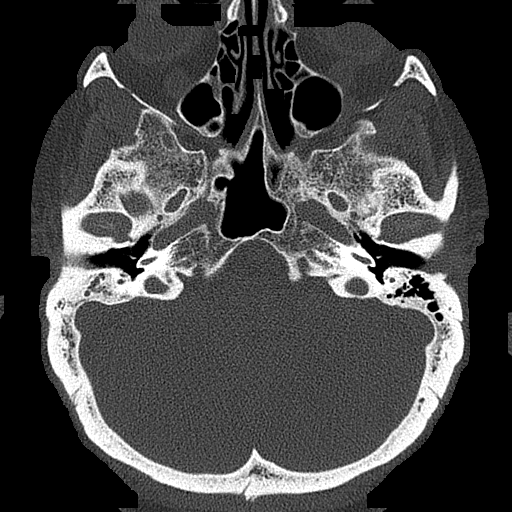
[im 50/64  bone]
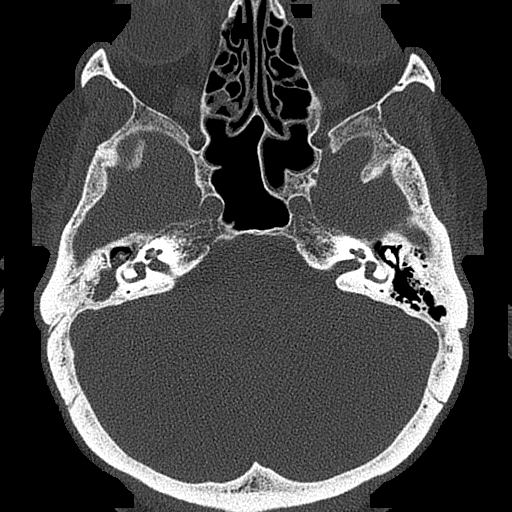
[im 57/64  bone]
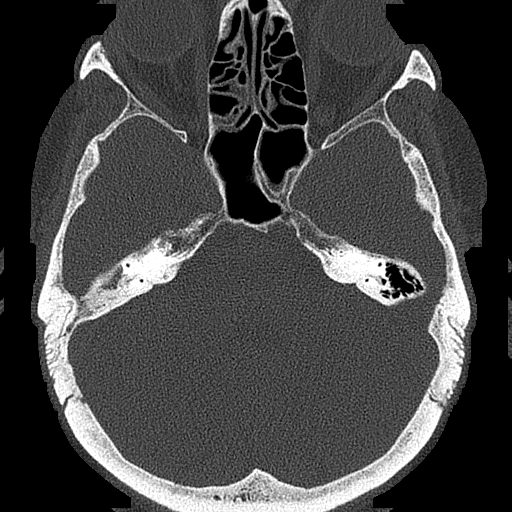

[Series 3: left · axial · 0.18mm/px · z∈[-158,-130]mm · 7 of 64 slices shown]
[im 8/64  bone]
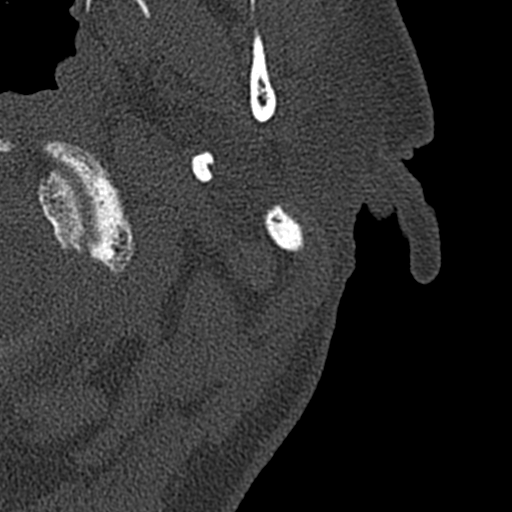
[im 16/64  bone]
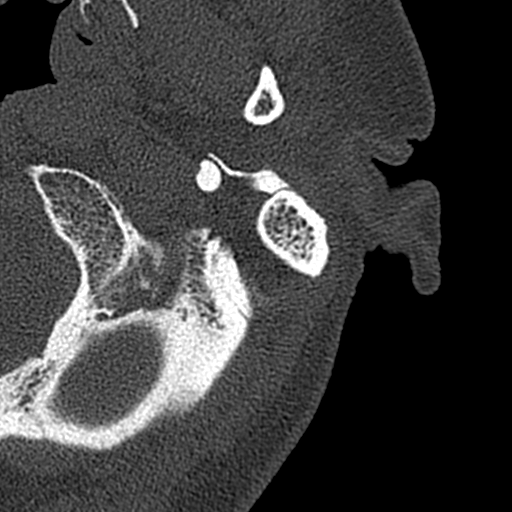
[im 24/64  bone]
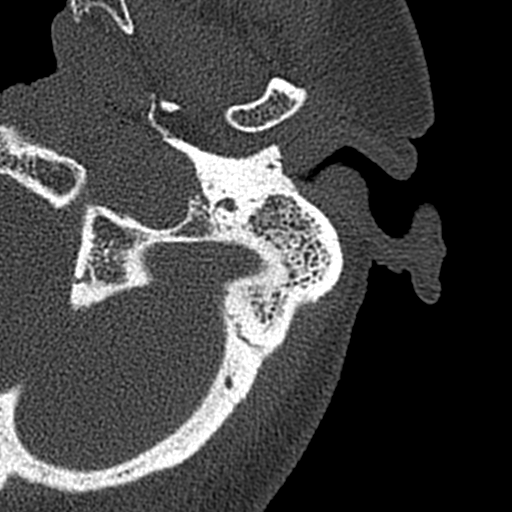
[im 32/64  bone]
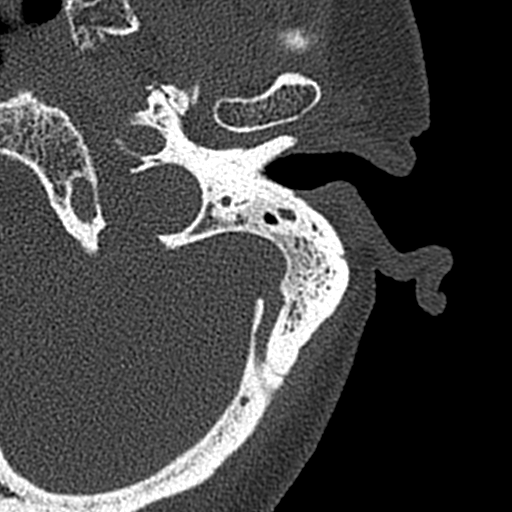
[im 40/64  bone]
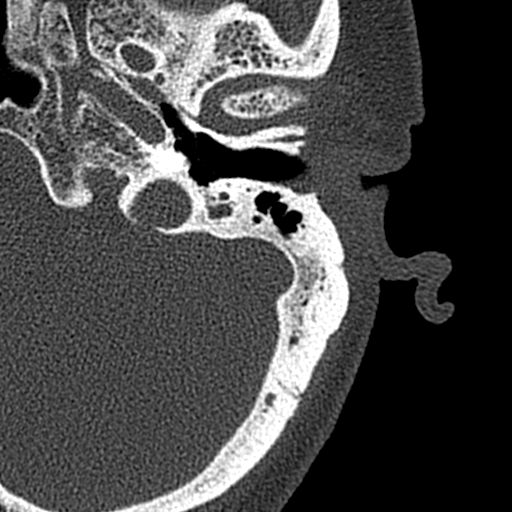
[im 48/64  bone]
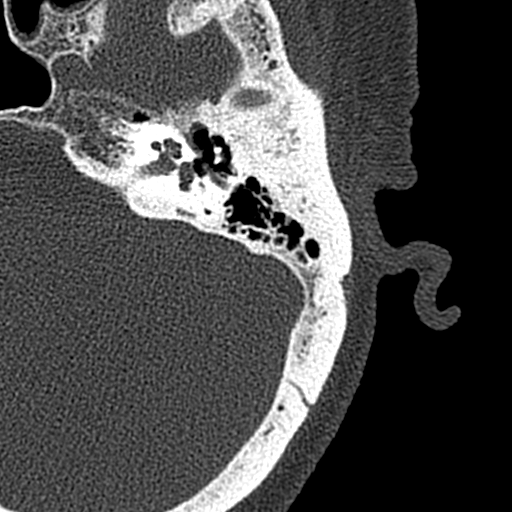
[im 56/64  bone]
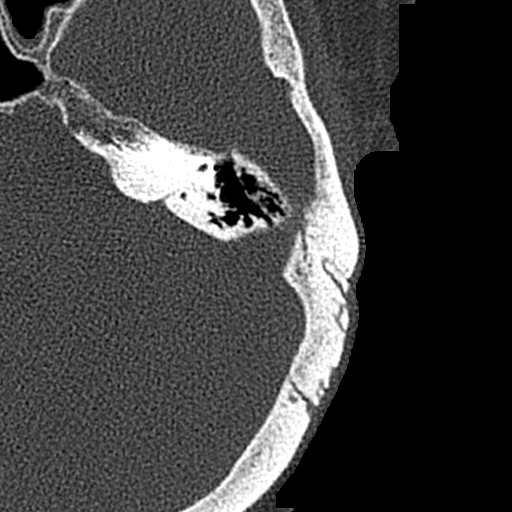

[Series 4: right · axial · 0.18mm/px · z∈[-158,-154]mm · 2 of 64 slices shown]
[im 8/64  bone]
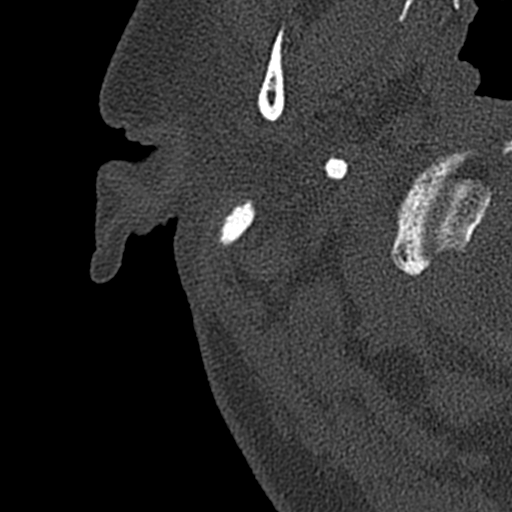
[im 16/64  bone]
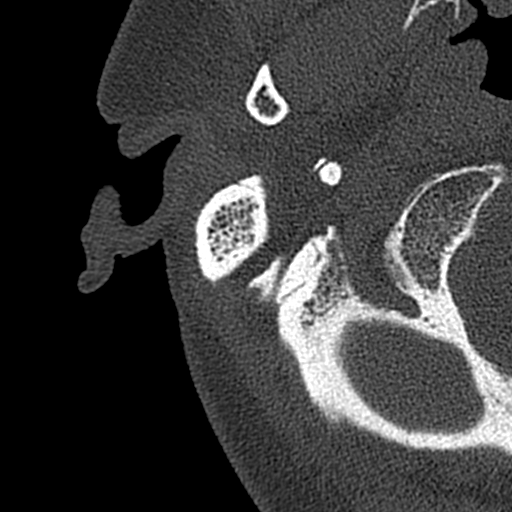

[17 of 30 positions shown; findings below may reference images not displayed]

FINDINGS: Paranasal sinuses are clear except for an insignificant
retention cyst of the floor of the left maxillary sinus.
Visualized intracranial contents appear unremarkable.  No
inflammatory disease is seen in the region of the left temporal
bone.  The mastoids are only mildly developmentally aerated.

On the right, the patient does in fact have a cholesteatoma in
Prussak's space with erosion of the scutum and extension into the
attic.  There may be some erosion of the ossicles.  No defect of
the tegmen is seen.  The cholesteatoma is probably on the order of
6 mm in size.
IMPRESSION: Cholesteatoma of Prussak's space on the right with erosion of the
scutum, extension into the attic and some erosion of the ossicles.

## 2010-10-01 ENCOUNTER — Other Ambulatory Visit: Payer: Self-pay | Admitting: *Deleted

## 2010-10-01 MED ORDER — ISOSORBIDE MONONITRATE ER 60 MG PO TB24: ORAL_TABLET | ORAL | Status: DC

## 2010-11-23 LAB — CBC
HCT: 36.5
MCHC: 34.3
MCV: 88.3
MCV: 88.4
Platelets: 216
Platelets: 235
RBC: 4.08
RDW: 13.4
WBC: 7.8

## 2010-11-23 LAB — HEPARIN LEVEL (UNFRACTIONATED): Heparin Unfractionated: 0.76 — ABNORMAL HIGH

## 2010-11-23 LAB — LIPID PANEL
Cholesterol: 251 — ABNORMAL HIGH
HDL: 37 — ABNORMAL LOW
Triglycerides: 246 — ABNORMAL HIGH

## 2010-11-23 LAB — BASIC METABOLIC PANEL
BUN: 13
CO2: 27
Chloride: 109
GFR calc Af Amer: >60
Potassium: 4.1

## 2010-12-10 ENCOUNTER — Telehealth: Payer: Self-pay | Admitting: *Deleted

## 2010-12-12 NOTE — Telephone Encounter (Signed)
Yes patient can stop ASA before surgery.5-7 days prior ideally. To check with surgeon

## 2010-12-12 NOTE — Telephone Encounter (Signed)
Surgery Center notified.

## 2010-12-13 ENCOUNTER — Encounter: Payer: Self-pay | Admitting: Cardiology

## 2010-12-13 ENCOUNTER — Ambulatory Visit (INDEPENDENT_AMBULATORY_CARE_PROVIDER_SITE_OTHER): Payer: Medicare Other | Admitting: Cardiology

## 2010-12-13 VITALS — BP 147/82 | HR 87 | Ht 60.0 in | Wt 128.0 lb

## 2010-12-13 DIAGNOSIS — I2510 Atherosclerotic heart disease of native coronary artery without angina pectoris: Secondary | ICD-10-CM

## 2010-12-13 DIAGNOSIS — H7190 Unspecified cholesteatoma, unspecified ear: Secondary | ICD-10-CM | POA: Insufficient documentation

## 2010-12-13 DIAGNOSIS — I739 Peripheral vascular disease, unspecified: Secondary | ICD-10-CM

## 2010-12-13 DIAGNOSIS — F17200 Nicotine dependence, unspecified, uncomplicated: Secondary | ICD-10-CM

## 2010-12-13 DIAGNOSIS — E782 Mixed hyperlipidemia: Secondary | ICD-10-CM

## 2010-12-13 MED ORDER — ASPIRIN EC 81 MG PO TBEC: 81.0000 mg | DELAYED_RELEASE_TABLET | Freq: Every day | ORAL | Status: AC

## 2010-12-13 NOTE — Assessment & Plan Note (Signed)
I counseled the patient again regarding her tobacco use. She did cut down to less than half a pack a day.

## 2010-12-13 NOTE — Assessment & Plan Note (Signed)
No cardiovascular contraindications prior to surgery. The patient has been 5 days of aspirin and can restart low-dose aspirin 81 mg a day post surgery.

## 2010-12-13 NOTE — Progress Notes (Signed)
HPI:  The patient is a 70 year old female with history of nonobstructive coronary artery disease, normal ABIs, hypertriglyceridemia and an intolerance. She's scheduled to undergo eye surgery for cholesteatoma tomorrow. She has been off aspirin for 5 days. Her triglycerides have much improved after she stopped drinking sodas typically 6-8 a day. Unfortunately she still continues to smoke despite counseling. She denies any chest pain shortness of breath orthopnea PND. She stable from a cardiovascular perspective. She does report hearing loss related to her cholesteatoma. Blood pressure slightly elevated in the office today but otherwise seems to be under good control and asked her to followup after surgery on this issue.   PMH: reviewed and listed in Problem List in Electronic Records (and see below)  Allergies/SH/FHX : available in Electronic Records for review  Medications: Current Outpatient Prescriptions on File Prior to Visit  Medication Sig Dispense Refill  . cetirizine (ZYRTEC) 10 MG tablet Take 10 mg by mouth daily.        . metoprolol (TOPROL-XL) 50 MG 24 hr tablet Take 50 mg by mouth daily.         additionally the patient is taking aspirin 325 mg a day but this was discontinued prior to her upcoming surgery WelChol 625 mg Thyroid 60 mg tablets  ROS: No nausea or vomiting. No fever or chills.No melena or hematochezia.No bleeding.No claudication  Physical Exam: BP 147/82  Pulse 87  Ht 5' (1.524 m)  Wt 128 lb (58.06 kg)  BMI 25.00 kg/m2 General: Well-nourished white female. Neck: Normal carotid upstroke no carotid bruits. No thyromegaly nonnodular thyroid. JVD is 5 cm Lungs: Clear breath sounds bilaterally somewhat diminished. No wheezing Cardiac: Regular rate and rhythm with normal S1-S2, no murmur PMI is nondisplaced Vascular: No cyanosis clubbing or edema. Normal dorsalis pedis and posterior tibial pulses bilaterally Skin: Warm and dry  12lead ECG: Normal sinus rhythm. Q  waves in V1 V2 and nonspecific ST elevation. Otherwise normal tracing Limited bedside ECHO:N/A

## 2010-12-13 NOTE — Assessment & Plan Note (Signed)
Triglycerides have improved significantly apparently. I suspect this is mainly due to dietary changes and marked decrease glucose intake.

## 2010-12-13 NOTE — Assessment & Plan Note (Signed)
No further work-up required 

## 2010-12-13 NOTE — Patient Instructions (Signed)
   Decrease Aspirin to 81mg  daily - can begin after surgery tomorrow Your physician wants you to follow up in:  1 year.  You will receive a reminder letter in the mail one-two months in advance.  If you don't receive a letter, please call our office to schedule the follow up appointment

## 2011-08-25 ENCOUNTER — Other Ambulatory Visit: Payer: Self-pay | Admitting: Cardiology

## 2011-08-27 ENCOUNTER — Other Ambulatory Visit: Payer: Self-pay | Admitting: Cardiology

## 2016-03-13 DIAGNOSIS — H524 Presbyopia: Secondary | ICD-10-CM | POA: Diagnosis not present

## 2016-03-13 DIAGNOSIS — H40053 Ocular hypertension, bilateral: Secondary | ICD-10-CM | POA: Diagnosis not present

## 2016-04-12 DIAGNOSIS — I10 Essential (primary) hypertension: Secondary | ICD-10-CM | POA: Diagnosis not present

## 2016-04-12 DIAGNOSIS — E039 Hypothyroidism, unspecified: Secondary | ICD-10-CM | POA: Diagnosis not present

## 2016-04-12 DIAGNOSIS — I5032 Chronic diastolic (congestive) heart failure: Secondary | ICD-10-CM | POA: Diagnosis not present

## 2016-04-12 DIAGNOSIS — E782 Mixed hyperlipidemia: Secondary | ICD-10-CM | POA: Diagnosis not present

## 2016-04-17 DIAGNOSIS — Z23 Encounter for immunization: Secondary | ICD-10-CM | POA: Diagnosis not present

## 2016-04-17 DIAGNOSIS — I10 Essential (primary) hypertension: Secondary | ICD-10-CM | POA: Diagnosis not present

## 2016-04-17 DIAGNOSIS — Z0001 Encounter for general adult medical examination with abnormal findings: Secondary | ICD-10-CM | POA: Diagnosis not present

## 2016-07-11 DIAGNOSIS — I10 Essential (primary) hypertension: Secondary | ICD-10-CM | POA: Diagnosis not present

## 2016-07-11 DIAGNOSIS — E782 Mixed hyperlipidemia: Secondary | ICD-10-CM | POA: Diagnosis not present

## 2016-07-11 DIAGNOSIS — I5032 Chronic diastolic (congestive) heart failure: Secondary | ICD-10-CM | POA: Diagnosis not present

## 2016-07-11 DIAGNOSIS — E039 Hypothyroidism, unspecified: Secondary | ICD-10-CM | POA: Diagnosis not present

## 2016-07-15 DIAGNOSIS — E039 Hypothyroidism, unspecified: Secondary | ICD-10-CM | POA: Diagnosis not present

## 2016-07-15 DIAGNOSIS — E782 Mixed hyperlipidemia: Secondary | ICD-10-CM | POA: Diagnosis not present

## 2016-07-15 DIAGNOSIS — E559 Vitamin D deficiency, unspecified: Secondary | ICD-10-CM | POA: Diagnosis not present

## 2016-07-15 DIAGNOSIS — I10 Essential (primary) hypertension: Secondary | ICD-10-CM | POA: Diagnosis not present

## 2016-07-15 DIAGNOSIS — I447 Left bundle-branch block, unspecified: Secondary | ICD-10-CM | POA: Diagnosis not present

## 2016-08-29 DIAGNOSIS — E039 Hypothyroidism, unspecified: Secondary | ICD-10-CM | POA: Diagnosis not present

## 2016-09-10 DIAGNOSIS — H25813 Combined forms of age-related cataract, bilateral: Secondary | ICD-10-CM | POA: Diagnosis not present

## 2016-09-23 DIAGNOSIS — M810 Age-related osteoporosis without current pathological fracture: Secondary | ICD-10-CM | POA: Diagnosis not present

## 2016-10-03 DIAGNOSIS — H5211 Myopia, right eye: Secondary | ICD-10-CM | POA: Diagnosis not present

## 2016-10-03 DIAGNOSIS — H25813 Combined forms of age-related cataract, bilateral: Secondary | ICD-10-CM | POA: Diagnosis not present

## 2016-10-15 DIAGNOSIS — I5032 Chronic diastolic (congestive) heart failure: Secondary | ICD-10-CM | POA: Diagnosis not present

## 2016-10-15 DIAGNOSIS — E039 Hypothyroidism, unspecified: Secondary | ICD-10-CM | POA: Diagnosis not present

## 2016-10-15 DIAGNOSIS — M810 Age-related osteoporosis without current pathological fracture: Secondary | ICD-10-CM | POA: Diagnosis not present

## 2016-10-15 DIAGNOSIS — I10 Essential (primary) hypertension: Secondary | ICD-10-CM | POA: Diagnosis not present

## 2016-10-15 DIAGNOSIS — E782 Mixed hyperlipidemia: Secondary | ICD-10-CM | POA: Diagnosis not present

## 2016-10-18 DIAGNOSIS — I5032 Chronic diastolic (congestive) heart failure: Secondary | ICD-10-CM | POA: Diagnosis not present

## 2016-10-18 DIAGNOSIS — J209 Acute bronchitis, unspecified: Secondary | ICD-10-CM | POA: Diagnosis not present

## 2016-10-18 DIAGNOSIS — E039 Hypothyroidism, unspecified: Secondary | ICD-10-CM | POA: Diagnosis not present

## 2016-10-18 DIAGNOSIS — E782 Mixed hyperlipidemia: Secondary | ICD-10-CM | POA: Diagnosis not present

## 2016-10-18 DIAGNOSIS — I447 Left bundle-branch block, unspecified: Secondary | ICD-10-CM | POA: Diagnosis not present

## 2016-10-18 DIAGNOSIS — I10 Essential (primary) hypertension: Secondary | ICD-10-CM | POA: Diagnosis not present

## 2016-11-25 ENCOUNTER — Other Ambulatory Visit (HOSPITAL_COMMUNITY): Payer: Self-pay

## 2016-11-29 ENCOUNTER — Ambulatory Visit: Admit: 2016-11-29 | Payer: Self-pay | Admitting: Ophthalmology

## 2016-11-29 SURGERY — PHACOEMULSIFICATION, CATARACT, WITH IOL INSERTION
Anesthesia: Monitor Anesthesia Care | Laterality: Left

## 2017-01-27 DIAGNOSIS — H25811 Combined forms of age-related cataract, right eye: Secondary | ICD-10-CM | POA: Diagnosis not present

## 2017-01-27 DIAGNOSIS — H40053 Ocular hypertension, bilateral: Secondary | ICD-10-CM | POA: Diagnosis not present

## 2017-01-27 DIAGNOSIS — H2513 Age-related nuclear cataract, bilateral: Secondary | ICD-10-CM | POA: Diagnosis not present

## 2017-02-26 NOTE — Patient Instructions (Signed)
Your procedure is scheduled on:  03/10/2017   Report to [facility] at  640   AM.  Call this number if you have problems the morning of surgery: [phone]   Do not eat food or drink liquids :After Midnight.      Take these medicines the morning of surgery with A SIP OF WATER: imdur, levothyroxine, claritin, toprol XL.   Do not wear jewelry, make-up or nail polish.  Do not wear lotions, powders, or perfumes. You may wear deodorant.  Do not shave 48 hours prior to surgery.  Do not bring valuables to the hospital.  Contacts, dentures or bridgework may not be worn into surgery.  Leave suitcase in the car. After surgery it may be brought to your room.  For patients admitted to the hospital, checkout time is 11:00 AM the day of discharge.   Patients discharged the day of surgery will not be allowed to drive home.  :     Please read over the following fact sheets that you were given: Coughing and Deep Breathing, Surgical Site Infection Prevention, Anesthesia Post-op Instructions and Care and Recovery After Surgery    Cataract A cataract is a clouding of the lens of the eye. When a lens becomes cloudy, vision is reduced based on the degree and nature of the clouding. Many cataracts reduce vision to some degree. Some cataracts make people more near-sighted as they develop. Other cataracts increase glare. Cataracts that are ignored and become worse can sometimes look white. The white color can be seen through the pupil. CAUSES   Aging. However, cataracts may occur at any age, even in newborns.   Certain drugs.   Trauma to the eye.   Certain diseases such as diabetes.   Specific eye diseases such as chronic inflammation inside the eye or a sudden attack of a rare form of glaucoma.   Inherited or acquired medical problems.  SYMPTOMS   Gradual, progressive drop in vision in the affected eye.   Severe, rapid visual loss. This most often happens when trauma is the cause.  DIAGNOSIS  To  detect a cataract, an eye doctor examines the lens. Cataracts are best diagnosed with an exam of the eyes with the pupils enlarged (dilated) by drops.  TREATMENT  For an early cataract, vision may improve by using different eyeglasses or stronger lighting. If that does not help your vision, surgery is the only effective treatment. A cataract needs to be surgically removed when vision loss interferes with your everyday activities, such as driving, reading, or watching TV. A cataract may also have to be removed if it prevents examination or treatment of another eye problem. Surgery removes the cloudy lens and usually replaces it with a substitute lens (intraocular lens, IOL).  At a time when both you and your doctor agree, the cataract will be surgically removed. If you have cataracts in both eyes, only one is usually removed at a time. This allows the operated eye to heal and be out of danger from any possible problems after surgery (such as infection or poor wound healing). In rare cases, a cataract may be doing damage to your eye. In these cases, your caregiver may advise surgical removal right away. The vast majority of people who have cataract surgery have better vision afterward. HOME CARE INSTRUCTIONS  If you are not planning surgery, you may be asked to do the following:  Use different eyeglasses.   Use stronger or brighter lighting.   Ask your eye  doctor about reducing your medicine dose or changing medicines if it is thought that a medicine caused your cataract. Changing medicines does not make the cataract go away on its own.   Become familiar with your surroundings. Poor vision can lead to injury. Avoid bumping into things on the affected side. You are at a higher risk for tripping or falling.   Exercise extreme care when driving or operating machinery.   Wear sunglasses if you are sensitive to bright light or experiencing problems with glare.  SEEK IMMEDIATE MEDICAL CARE IF:   You have  a worsening or sudden vision loss.   You notice redness, swelling, or increasing pain in the eye.   You have a fever.  Document Released: 01/28/2005 Document Revised: 01/17/2011 Document Reviewed: 09/21/2010 [facility] Patient Information 2012 [place].PATIENT INSTRUCTIONS POST-ANESTHESIA  IMMEDIATELY FOLLOWING SURGERY:  Do not drive or operate machinery for the first twenty four hours after surgery.  Do not make any important decisions for twenty four hours after surgery or while taking narcotic pain medications or sedatives.  If you develop intractable nausea and vomiting or a severe headache please notify your doctor immediately.  FOLLOW-UP:  Please make an appointment with your surgeon as instructed. You do not need to follow up with anesthesia unless specifically instructed to do so.  WOUND CARE INSTRUCTIONS (if applicable):  Keep a dry clean dressing on the anesthesia/puncture wound site if there is drainage.  Once the wound has quit draining you may leave it open to air.  Generally you should leave the bandage intact for twenty four hours unless there is drainage.  If the epidural site drains for more than 36-48 hours please call the anesthesia department.  QUESTIONS?:  Please feel free to call your physician or the hospital operator if you have any questions, and they will be happy to assist you.

## 2017-03-03 ENCOUNTER — Encounter (HOSPITAL_COMMUNITY): Payer: Self-pay

## 2017-03-03 ENCOUNTER — Other Ambulatory Visit: Payer: Self-pay

## 2017-03-03 ENCOUNTER — Encounter (HOSPITAL_COMMUNITY)
Admission: RE | Admit: 2017-03-03 | Discharge: 2017-03-03 | Disposition: A | Discharge status: Home / Self Care | Payer: Medicare Other | Source: Ambulatory Visit | Attending: Ophthalmology | Admitting: Ophthalmology

## 2017-03-03 DIAGNOSIS — Z01818 Encounter for other preprocedural examination: Secondary | ICD-10-CM | POA: Insufficient documentation

## 2017-03-03 DIAGNOSIS — Z01812 Encounter for preprocedural laboratory examination: Secondary | ICD-10-CM | POA: Insufficient documentation

## 2017-03-03 DIAGNOSIS — H269 Unspecified cataract: Secondary | ICD-10-CM | POA: Diagnosis not present

## 2017-03-03 DIAGNOSIS — R9431 Abnormal electrocardiogram [ECG] [EKG]: Secondary | ICD-10-CM | POA: Insufficient documentation

## 2017-03-03 HISTORY — DX: Hypothyroidism, unspecified: E03.9

## 2017-03-03 HISTORY — DX: Unspecified osteoarthritis, unspecified site: M19.90

## 2017-03-03 LAB — CBC WITH DIFFERENTIAL/PLATELET
BASOS ABS: 0.1 10*3/uL (ref 0.0–0.1)
Basophils Relative: 1 %
EOS PCT: 6 %
Eosinophils Absolute: 0.4 10*3/uL (ref 0.0–0.7)
HEMATOCRIT: 44.1 % (ref 36.0–46.0)
Hemoglobin: 14.7 g/dL (ref 12.0–15.0)
LYMPHS ABS: 2.4 10*3/uL (ref 0.7–4.0)
LYMPHS PCT: 32 %
MCH: 29.1 pg (ref 26.0–34.0)
MCHC: 33.3 g/dL (ref 30.0–36.0)
MCV: 87.2 fL (ref 78.0–100.0)
MONO ABS: 0.7 10*3/uL (ref 0.1–1.0)
MONOS PCT: 10 %
Neutro Abs: 3.8 10*3/uL (ref 1.7–7.7)
Neutrophils Relative %: 51 %
PLATELETS: 273 10*3/uL (ref 150–400)
RBC: 5.06 MIL/uL (ref 3.87–5.11)
RDW: 12.9 % (ref 11.5–15.5)
WBC: 7.5 10*3/uL (ref 4.0–10.5)

## 2017-03-03 LAB — BASIC METABOLIC PANEL
Anion gap: 9 (ref 5–15)
BUN: 11 mg/dL (ref 6–20)
CALCIUM: 9.1 mg/dL (ref 8.9–10.3)
CO2: 28 mmol/L (ref 22–32)
CREATININE: 0.75 mg/dL (ref 0.44–1.00)
Chloride: 103 mmol/L (ref 101–111)
GFR calc Af Amer: >60 mL/min (ref >60)
Glucose, Bld: 100 mg/dL — ABNORMAL HIGH (ref 65–99)
Potassium: 3.7 mmol/L (ref 3.5–5.1)
Sodium: 140 mmol/L (ref 135–145)

## 2017-03-10 ENCOUNTER — Encounter (HOSPITAL_COMMUNITY)
Admission: RE | Disposition: A | Discharge status: Home / Self Care | Payer: Self-pay | Source: Ambulatory Visit | Attending: Ophthalmology

## 2017-03-10 ENCOUNTER — Ambulatory Visit (HOSPITAL_COMMUNITY): Payer: Medicare Other | Admitting: Anesthesiology

## 2017-03-10 ENCOUNTER — Encounter (HOSPITAL_COMMUNITY): Payer: Self-pay

## 2017-03-10 ENCOUNTER — Ambulatory Visit (HOSPITAL_COMMUNITY)
Admission: RE | Admit: 2017-03-10 | Discharge: 2017-03-10 | Disposition: A | Discharge status: Home / Self Care | Payer: Medicare Other | Source: Ambulatory Visit | Attending: Ophthalmology | Admitting: Ophthalmology

## 2017-03-10 DIAGNOSIS — Z7982 Long term (current) use of aspirin: Secondary | ICD-10-CM | POA: Insufficient documentation

## 2017-03-10 DIAGNOSIS — Z79899 Other long term (current) drug therapy: Secondary | ICD-10-CM | POA: Diagnosis not present

## 2017-03-10 DIAGNOSIS — J449 Chronic obstructive pulmonary disease, unspecified: Secondary | ICD-10-CM | POA: Diagnosis not present

## 2017-03-10 DIAGNOSIS — H25811 Combined forms of age-related cataract, right eye: Secondary | ICD-10-CM | POA: Diagnosis not present

## 2017-03-10 DIAGNOSIS — F17200 Nicotine dependence, unspecified, uncomplicated: Secondary | ICD-10-CM | POA: Diagnosis not present

## 2017-03-10 DIAGNOSIS — F419 Anxiety disorder, unspecified: Secondary | ICD-10-CM | POA: Insufficient documentation

## 2017-03-10 DIAGNOSIS — I2510 Atherosclerotic heart disease of native coronary artery without angina pectoris: Secondary | ICD-10-CM | POA: Diagnosis not present

## 2017-03-10 DIAGNOSIS — I10 Essential (primary) hypertension: Secondary | ICD-10-CM | POA: Insufficient documentation

## 2017-03-10 DIAGNOSIS — H2511 Age-related nuclear cataract, right eye: Secondary | ICD-10-CM | POA: Diagnosis not present

## 2017-03-10 DIAGNOSIS — E039 Hypothyroidism, unspecified: Secondary | ICD-10-CM | POA: Diagnosis not present

## 2017-03-10 HISTORY — PX: CATARACT EXTRACTION W/PHACO: SHX586

## 2017-03-10 SURGERY — PHACOEMULSIFICATION, CATARACT, WITH IOL INSERTION
Anesthesia: Monitor Anesthesia Care | Site: Eye | Laterality: Right

## 2017-03-10 MED ORDER — ONDANSETRON 4 MG PO TBDP
ORAL_TABLET | ORAL | Status: AC
  Filled 2017-03-10: qty 1

## 2017-03-10 MED ORDER — PHENYLEPHRINE HCL 2.5 % OP SOLN
1.0000 [drp] | OPHTHALMIC | Status: AC
  Administered 2017-03-10 (×3): 1 [drp] via OPHTHALMIC

## 2017-03-10 MED ORDER — NEOMYCIN-POLYMYXIN-DEXAMETH 3.5-10000-0.1 OP SUSP
OPHTHALMIC | Status: DC | PRN
  Administered 2017-03-10: 2 [drp] via OPHTHALMIC

## 2017-03-10 MED ORDER — LIDOCAINE HCL (PF) 1 % IJ SOLN
INTRAOCULAR | Status: DC | PRN
  Administered 2017-03-10: .6 mL via OPHTHALMIC

## 2017-03-10 MED ORDER — LIDOCAINE 3.5 % OP GEL OPTIME - NO CHARGE
OPHTHALMIC | Status: DC | PRN
  Administered 2017-03-10: 2 [drp] via OPHTHALMIC

## 2017-03-10 MED ORDER — BSS IO SOLN
INTRAOCULAR | Status: DC | PRN
  Administered 2017-03-10: 500 mL

## 2017-03-10 MED ORDER — CYCLOPENTOLATE-PHENYLEPHRINE 0.2-1 % OP SOLN
1.0000 [drp] | OPHTHALMIC | Status: AC
  Administered 2017-03-10 (×3): 1 [drp] via OPHTHALMIC

## 2017-03-10 MED ORDER — POVIDONE-IODINE 5 % OP SOLN
OPHTHALMIC | Status: DC | PRN
  Administered 2017-03-10: 1 via OPHTHALMIC

## 2017-03-10 MED ORDER — BSS IO SOLN
INTRAOCULAR | Status: DC | PRN
  Administered 2017-03-10: 15 mL

## 2017-03-10 MED ORDER — PROVISC 10 MG/ML IO SOLN
INTRAOCULAR | Status: DC | PRN
  Administered 2017-03-10: 0.85 mL via INTRAOCULAR

## 2017-03-10 MED ORDER — ONDANSETRON 4 MG PO TBDP
4.0000 mg | ORAL_TABLET | Freq: Once | ORAL | Status: AC
  Administered 2017-03-10: 4 mg via ORAL

## 2017-03-10 MED ORDER — MIDAZOLAM HCL 2 MG/2ML IJ SOLN
INTRAMUSCULAR | Status: AC
  Filled 2017-03-10: qty 2

## 2017-03-10 MED ORDER — LIDOCAINE HCL 3.5 % OP GEL
1.0000 "application " | Freq: Once | OPHTHALMIC | Status: AC
  Administered 2017-03-10: 1 via OPHTHALMIC

## 2017-03-10 MED ORDER — LACTATED RINGERS IV SOLN
INTRAVENOUS | Status: DC
  Administered 2017-03-10: 07:00:00 via INTRAVENOUS

## 2017-03-10 MED ORDER — MIDAZOLAM HCL 2 MG/2ML IJ SOLN
1.0000 mg | Freq: Once | INTRAMUSCULAR | Status: AC | PRN
Start: 2017-03-10 — End: 2017-03-10
  Administered 2017-03-10: 2 mg via INTRAVENOUS

## 2017-03-10 MED ORDER — TETRACAINE HCL 0.5 % OP SOLN
1.0000 [drp] | OPHTHALMIC | Status: AC
  Administered 2017-03-10 (×3): 1 [drp] via OPHTHALMIC

## 2017-03-10 SURGICAL SUPPLY — 12 items
CLOTH BEACON ORANGE TIMEOUT ST (SAFETY) ×1 IMPLANT
EYE SHIELD UNIVERSAL CLEAR (GAUZE/BANDAGES/DRESSINGS) ×2 IMPLANT
GLOVE BIOGEL PI IND STRL 6.5 (GLOVE) IMPLANT
GLOVE BIOGEL PI IND STRL 7.0 (GLOVE) IMPLANT
GLOVE BIOGEL PI INDICATOR 6.5 (GLOVE) ×1
GLOVE BIOGEL PI INDICATOR 7.0 (GLOVE) ×1
LENS ALC ACRYL/TECN (Ophthalmic Related) ×1 IMPLANT
PAD ARMBOARD 7.5X6 YLW CONV (MISCELLANEOUS) ×2 IMPLANT
SYRINGE LUER LOK 1CC (MISCELLANEOUS) ×1 IMPLANT
TAPE SURG TRANSPORE 1 IN (GAUZE/BANDAGES/DRESSINGS) IMPLANT
TAPE SURGICAL TRANSPORE 1 IN (GAUZE/BANDAGES/DRESSINGS) ×1
WATER STERILE IRR 250ML POUR (IV SOLUTION) ×1 IMPLANT

## 2017-03-10 NOTE — Anesthesia Procedure Notes (Signed)
Procedure Name: MAC Date/Time: 03/10/2017 7:55 AM Performed by: Andree Elk Amy A, CRNA Pre-anesthesia Checklist: Patient identified, Timeout performed, Emergency Drugs available, Suction available and Patient being monitored Oxygen Delivery Method: Nasal cannula

## 2017-03-10 NOTE — Anesthesia Preprocedure Evaluation (Signed)
Anesthesia Evaluation    Airway Mallampati: I  TM Distance: >3 FB     Dental  (+) Partial Upper   Pulmonary shortness of breath (stable, underlying COPD), COPD, Current Smoker,    Pulmonary exam normal        Cardiovascular METS: 5 - 7 Mets hypertension, Pt. on medications and Pt. on home beta blockers + angina (no recent chest pain/SOB ) + CAD and + Peripheral Vascular Disease   Rhythm:Regular Rate:Normal  Normal sinus rhythm Left axis deviation Anteroseptal infarct , age undetermined T wave abnormality, consider lateral ischemia Abnormal ECG,    03 Mar 2017   Neuro/Psych Anxiety    GI/Hepatic   Endo/Other  Hypothyroidism   Renal/GU      Musculoskeletal  (+) Arthritis ,   Abdominal Normal abdominal exam  (+)   Peds  Hematology   Anesthesia Other Findings   Reproductive/Obstetrics                             Anesthesia Physical Anesthesia Plan  ASA: III  Anesthesia Plan: MAC   Post-op Pain Management:    Induction:   PONV Risk Score and Plan:   Airway Management Planned: Nasal Cannula  Additional Equipment:   Intra-op Plan:   Post-operative Plan:   Informed Consent: I have reviewed the patients History and Physical, chart, labs and discussed the procedure including the risks, benefits and alternatives for the proposed anesthesia with the patient or authorized representative who has indicated his/her understanding and acceptance.   Dental advisory given  Plan Discussed with: CRNA  Anesthesia Plan Comments:         Anesthesia Quick Evaluation  

## 2017-03-10 NOTE — Addendum Note (Signed)
Addendum  created 03/10/17 1249 by Earleen NewportAdams, Amy A, CRNA   Charge Capture section accepted

## 2017-03-10 NOTE — Transfer of Care (Signed)
Immediate Anesthesia Transfer of Care Note  Patient: Linda Sherman  Procedure(s) Performed: CATARACT EXTRACTION PHACO AND INTRAOCULAR LENS PLACEMENT RIGHT EYE (Right Eye)  Patient Location: PACU  Anesthesia Type:MAC  Level of Consciousness: awake, alert , oriented and patient cooperative  Airway & Oxygen Therapy: Patient Spontanous Breathing  Post-op Assessment: Report given to RN and Post -op Vital signs reviewed and stable  Post vital signs: Reviewed and stable  Last Vitals:  Vitals:   03/10/17 0745 03/10/17 0750  BP: 129/66 (!) 157/59  Resp: 16 12  Temp:    SpO2: 99% 99%    Last Pain:  Vitals:   03/10/17 0653  TempSrc: Oral         Complications: No apparent anesthesia complications

## 2017-03-10 NOTE — Anesthesia Postprocedure Evaluation (Signed)
Anesthesia Post Note  Patient: Linda Sherman  Procedure(s) Performed: CATARACT EXTRACTION PHACO AND INTRAOCULAR LENS PLACEMENT RIGHT EYE (Right Eye)  Patient location during evaluation: Short Stay Anesthesia Type: MAC Level of consciousness: awake and alert, oriented and patient cooperative Pain management: pain level controlled Cardiovascular status: stable Postop Assessment: no apparent nausea or vomiting Anesthetic complications: no     Last Vitals:  Vitals:   03/10/17 0745 03/10/17 0750  BP: 129/66 (!) 157/59  Resp: 16 12  Temp:    SpO2: 99% 99%    Last Pain:  Vitals:   03/10/17 0653  TempSrc: Oral                 ADAMS, AMY A

## 2017-03-10 NOTE — H&P (Signed)
I have reviewed the H&P, the patient was re-examined, and I have identified no interval changes in medical condition and plan of care since the history and physical of record  

## 2017-03-10 NOTE — Discharge Instructions (Signed)

## 2017-03-10 NOTE — Op Note (Signed)
Date of Admission: 03/10/2017  Date of Surgery: 03/10/2017  Pre-Op Dx: Cataract Right  Eye  Post-Op Dx: Senile Combined Cataract  Right  Eye,  Dx Code K06.770  Surgeon: Tonny Branch, M.D.  Assistants: None  Anesthesia: Topical with MAC  Indications: Painless, progressive loss of vision with compromise of daily activities.  Surgery: Cataract Extraction with Intraocular lens Implant Right Eye  Discription: The patient had dilating drops and viscous lidocaine placed into the Right eye in the pre-op holding area. After transfer to the operating room, a time out was performed. The patient was then prepped and draped. Beginning with a 109m blade a paracentesis port was made at the surgeon's 2 o'clock position. The anterior chamber was then filled with 1% non-preserved lidocaine with epinepherine. This was followed by filling the anterior chamber with Provisc.  A 2.474mkeratome blade was used to make a clear corneal incision at the temporal limbus.  A bent cystatome needle was used to create a continuous tear capsulotomy. Hydrodissection was performed with balanced salt solution on a Fine canula. The lens nucleus was then removed using the phacoemulsification handpiece. Residual cortex was removed with the I&A handpiece. The anterior chamber and capsular bag were refilled with Provisc. A posterior chamber intraocular lens was placed into the capsular bag with it's injector. The implant was positioned with the Kuglan hook. The Provisc was then removed from the anterior chamber and capsular bag with the I&A handpiece. Stromal hydration of the main incision and paracentesis port was performed with BSS on a Fine canula. The wounds were tested for leak which was negative. The patient tolerated the procedure well. There were no operative complications. The patient was then transferred to the recovery room in stable condition.  Complications: None  Specimen: None  EBL: None  Prosthetic device: Abbott Technis,  PCB00, power 25.0, SN [id number]

## 2017-03-11 ENCOUNTER — Encounter (HOSPITAL_COMMUNITY): Payer: Self-pay | Admitting: Ophthalmology

## 2017-03-17 DIAGNOSIS — H25812 Combined forms of age-related cataract, left eye: Secondary | ICD-10-CM | POA: Diagnosis not present

## 2017-03-19 ENCOUNTER — Encounter (HOSPITAL_COMMUNITY)
Admission: RE | Admit: 2017-03-19 | Discharge: 2017-03-19 | Disposition: A | Discharge status: Home / Self Care | Payer: Medicare Other | Source: Ambulatory Visit | Attending: Ophthalmology | Admitting: Ophthalmology

## 2017-03-19 ENCOUNTER — Encounter (HOSPITAL_COMMUNITY): Payer: Self-pay

## 2017-03-20 ENCOUNTER — Other Ambulatory Visit (HOSPITAL_COMMUNITY): Payer: Medicare Other

## 2017-03-24 ENCOUNTER — Ambulatory Visit (HOSPITAL_COMMUNITY)
Admission: RE | Admit: 2017-03-24 | Discharge: 2017-03-24 | Disposition: A | Discharge status: Home / Self Care | Payer: Medicare Other | Source: Ambulatory Visit | Attending: Ophthalmology | Admitting: Ophthalmology

## 2017-03-24 ENCOUNTER — Ambulatory Visit (HOSPITAL_COMMUNITY): Payer: Medicare Other | Admitting: Anesthesiology

## 2017-03-24 ENCOUNTER — Encounter (HOSPITAL_COMMUNITY)
Admission: RE | Disposition: A | Discharge status: Home / Self Care | Payer: Self-pay | Source: Ambulatory Visit | Attending: Ophthalmology

## 2017-03-24 ENCOUNTER — Encounter (HOSPITAL_COMMUNITY): Payer: Self-pay | Admitting: Ophthalmology

## 2017-03-24 DIAGNOSIS — Z888 Allergy status to other drugs, medicaments and biological substances status: Secondary | ICD-10-CM | POA: Diagnosis not present

## 2017-03-24 DIAGNOSIS — I739 Peripheral vascular disease, unspecified: Secondary | ICD-10-CM | POA: Diagnosis not present

## 2017-03-24 DIAGNOSIS — J449 Chronic obstructive pulmonary disease, unspecified: Secondary | ICD-10-CM | POA: Insufficient documentation

## 2017-03-24 DIAGNOSIS — I25119 Atherosclerotic heart disease of native coronary artery with unspecified angina pectoris: Secondary | ICD-10-CM | POA: Diagnosis not present

## 2017-03-24 DIAGNOSIS — M1990 Unspecified osteoarthritis, unspecified site: Secondary | ICD-10-CM | POA: Diagnosis not present

## 2017-03-24 DIAGNOSIS — I498 Other specified cardiac arrhythmias: Secondary | ICD-10-CM | POA: Diagnosis not present

## 2017-03-24 DIAGNOSIS — I10 Essential (primary) hypertension: Secondary | ICD-10-CM | POA: Insufficient documentation

## 2017-03-24 DIAGNOSIS — H2512 Age-related nuclear cataract, left eye: Secondary | ICD-10-CM | POA: Diagnosis not present

## 2017-03-24 DIAGNOSIS — H25812 Combined forms of age-related cataract, left eye: Secondary | ICD-10-CM | POA: Diagnosis not present

## 2017-03-24 DIAGNOSIS — F17200 Nicotine dependence, unspecified, uncomplicated: Secondary | ICD-10-CM | POA: Diagnosis not present

## 2017-03-24 DIAGNOSIS — E039 Hypothyroidism, unspecified: Secondary | ICD-10-CM | POA: Insufficient documentation

## 2017-03-24 DIAGNOSIS — F419 Anxiety disorder, unspecified: Secondary | ICD-10-CM | POA: Diagnosis not present

## 2017-03-24 HISTORY — PX: CATARACT EXTRACTION W/PHACO: SHX586

## 2017-03-24 SURGERY — PHACOEMULSIFICATION, CATARACT, WITH IOL INSERTION
Anesthesia: Monitor Anesthesia Care | Site: Eye | Laterality: Left

## 2017-03-24 MED ORDER — BSS IO SOLN
INTRAOCULAR | Status: DC | PRN
  Administered 2017-03-24: 15 mL

## 2017-03-24 MED ORDER — EPINEPHRINE PF 1 MG/ML IJ SOLN
INTRAMUSCULAR | Status: AC
Start: 2017-03-24 — End: 2017-03-24
  Filled 2017-03-24: qty 1

## 2017-03-24 MED ORDER — FENTANYL CITRATE (PF) 100 MCG/2ML IJ SOLN
25.0000 ug | Freq: Once | INTRAMUSCULAR | Status: AC
  Administered 2017-03-24: 25 ug via INTRAVENOUS

## 2017-03-24 MED ORDER — NEOMYCIN-POLYMYXIN-DEXAMETH 3.5-10000-0.1 OP SUSP
OPHTHALMIC | Status: DC | PRN
  Administered 2017-03-24: 2 [drp] via OPHTHALMIC

## 2017-03-24 MED ORDER — PHENYLEPHRINE HCL 2.5 % OP SOLN
1.0000 [drp] | OPHTHALMIC | Status: AC
  Administered 2017-03-24 (×3): 1 [drp] via OPHTHALMIC

## 2017-03-24 MED ORDER — POVIDONE-IODINE 5 % OP SOLN
OPHTHALMIC | Status: DC | PRN
  Administered 2017-03-24: 1 via OPHTHALMIC

## 2017-03-24 MED ORDER — FENTANYL CITRATE (PF) 100 MCG/2ML IJ SOLN
INTRAMUSCULAR | Status: AC
  Filled 2017-03-24: qty 2

## 2017-03-24 MED ORDER — LIDOCAINE HCL (PF) 1 % IJ SOLN
INTRAMUSCULAR | Status: DC | PRN
  Administered 2017-03-24: .8 mL via OPHTHALMIC

## 2017-03-24 MED ORDER — CYCLOPENTOLATE-PHENYLEPHRINE 0.2-1 % OP SOLN
1.0000 [drp] | OPHTHALMIC | Status: AC
  Administered 2017-03-24 (×3): 1 [drp] via OPHTHALMIC

## 2017-03-24 MED ORDER — EPINEPHRINE PF 1 MG/ML IJ SOLN
INTRAOCULAR | Status: DC | PRN
  Administered 2017-03-24: 500 mL

## 2017-03-24 MED ORDER — PROVISC 10 MG/ML IO SOLN
INTRAOCULAR | Status: DC | PRN
  Administered 2017-03-24: 0.85 mL via INTRAOCULAR

## 2017-03-24 MED ORDER — TETRACAINE HCL 0.5 % OP SOLN
1.0000 [drp] | OPHTHALMIC | Status: AC
  Administered 2017-03-24 (×3): 1 [drp] via OPHTHALMIC

## 2017-03-24 MED ORDER — LACTATED RINGERS IV SOLN
INTRAVENOUS | Status: DC
  Administered 2017-03-24: 09:00:00 via INTRAVENOUS

## 2017-03-24 MED ORDER — LIDOCAINE HCL 3.5 % OP GEL
1.0000 "application " | Freq: Once | OPHTHALMIC | Status: AC
  Administered 2017-03-24: 1 via OPHTHALMIC

## 2017-03-24 MED ORDER — MIDAZOLAM HCL 2 MG/2ML IJ SOLN
1.0000 mg | INTRAMUSCULAR | Status: AC
  Administered 2017-03-24: 2 mg via INTRAVENOUS

## 2017-03-24 MED ORDER — MIDAZOLAM HCL 2 MG/2ML IJ SOLN
INTRAMUSCULAR | Status: AC
  Filled 2017-03-24: qty 2

## 2017-03-24 SURGICAL SUPPLY — 13 items
CLOTH BEACON ORANGE TIMEOUT ST (SAFETY) ×3 IMPLANT
EYE SHIELD UNIVERSAL CLEAR (GAUZE/BANDAGES/DRESSINGS) ×2 IMPLANT
GLOVE BIOGEL PI IND STRL 6.5 (GLOVE) IMPLANT
GLOVE BIOGEL PI IND STRL 7.0 (GLOVE) IMPLANT
GLOVE BIOGEL PI INDICATOR 6.5 (GLOVE) ×2
GLOVE BIOGEL PI INDICATOR 7.0 (GLOVE) ×2
GLOVE SURG SS PI 7.5 STRL IVOR (GLOVE) ×2 IMPLANT
LENS ALC ACRYL/TECN (Ophthalmic Related) ×2 IMPLANT
PAD ARMBOARD 7.5X6 YLW CONV (MISCELLANEOUS) ×2 IMPLANT
SYRINGE LUER LOK 1CC (MISCELLANEOUS) ×2 IMPLANT
TAPE SURG TRANSPORE 1 IN (GAUZE/BANDAGES/DRESSINGS) ×1 IMPLANT
TAPE SURGICAL TRANSPORE 1 IN (GAUZE/BANDAGES/DRESSINGS) ×2
WATER STERILE IRR 250ML POUR (IV SOLUTION) ×2 IMPLANT

## 2017-03-24 NOTE — Discharge Instructions (Signed)

## 2017-03-24 NOTE — Transfer of Care (Signed)
Immediate Anesthesia Transfer of Care Note  Patient: Linda Sherman  Procedure(s) Performed: CATARACT EXTRACTION PHACO AND INTRAOCULAR LENS PLACEMENT LEFT EYE (Left Eye)  Patient Location: short stay  Anesthesia Type:MAC  Level of Consciousness: awake, alert  and patient cooperative  Airway & Oxygen Therapy: Patient Spontanous Breathing  Post-op Assessment: Report given to RN and Post -op Vital signs reviewed and stable  Post vital signs: Reviewed and stable  Last Vitals:  Vitals:   03/24/17 0814 03/24/17 0830  BP: (!) 162/72   Pulse: 60   Resp: 18 11  Temp: 36.8 C   SpO2: 96% 99%    Last Pain: There were no vitals filed for this visit.       Complications: No apparent anesthesia complications

## 2017-03-24 NOTE — H&P (Signed)
I have reviewed the H&P, the patient was re-examined, and I have identified no interval changes in medical condition and plan of care since the history and physical of record  

## 2017-03-24 NOTE — Anesthesia Preprocedure Evaluation (Signed)
Anesthesia Evaluation    Airway Mallampati: I  TM Distance: >3 FB     Dental  (+) Partial Upper   Pulmonary shortness of breath (stable, underlying COPD), COPD, Current Smoker,    Pulmonary exam normal        Cardiovascular METS: 5 - 7 Mets hypertension, Pt. on medications and Pt. on home beta blockers + angina (no recent chest pain/SOB ) + CAD and + Peripheral Vascular Disease   Rhythm:Regular Rate:Normal  Normal sinus rhythm Left axis deviation Anteroseptal infarct , age undetermined T wave abnormality, consider lateral ischemia Abnormal ECG,    03 Mar 2017   Neuro/Psych Anxiety    GI/Hepatic   Endo/Other  Hypothyroidism   Renal/GU      Musculoskeletal  (+) Arthritis ,   Abdominal Normal abdominal exam  (+)   Peds  Hematology   Anesthesia Other Findings   Reproductive/Obstetrics                             Anesthesia Physical Anesthesia Plan  ASA: III  Anesthesia Plan: MAC   Post-op Pain Management:    Induction:   PONV Risk Score and Plan:   Airway Management Planned: Nasal Cannula  Additional Equipment:   Intra-op Plan:   Post-operative Plan:   Informed Consent: I have reviewed the patients History and Physical, chart, labs and discussed the procedure including the risks, benefits and alternatives for the proposed anesthesia with the patient or authorized representative who has indicated his/her understanding and acceptance.   Dental advisory given  Plan Discussed with: CRNA  Anesthesia Plan Comments:         Anesthesia Quick Evaluation

## 2017-03-24 NOTE — Anesthesia Procedure Notes (Signed)
Procedure Name: MAC Date/Time: 03/24/2017 8:41 AM Performed by: Vista Deck, CRNA Pre-anesthesia Checklist: Patient identified, Emergency Drugs available, Suction available, Timeout performed and Patient being monitored Patient Re-evaluated:Patient Re-evaluated prior to induction Oxygen Delivery Method: Nasal Cannula

## 2017-03-24 NOTE — Op Note (Signed)
Date of Admission: 03/24/2017  Date of Surgery: 03/24/2017  Pre-Op Dx: Cataract Left  Eye  Post-Op Dx: Senile Combined Cataract  Left  Eye,  Dx Code K59.935  Surgeon: Tonny Branch, M.D.  Assistants: None  Anesthesia: Topical with MAC  Indications: Painless, progressive loss of vision with compromise of daily activities.  Surgery: Cataract Extraction with Intraocular lens Implant Left Eye  Discription: The patient had dilating drops and viscous lidocaine placed into the Left eye in the pre-op holding area. After transfer to the operating room, a time out was performed. The patient was then prepped and draped. Beginning with a 53m blade a paracentesis port was made at the surgeon's 2 o'clock position. The anterior chamber was then filled with 1% non-preserved lidocaine. This was followed by filling the anterior chamber with Provisc.  A 2.419mkeratome blade was used to make a clear corneal incision at the temporal limbus.  A bent cystatome needle was used to create a continuous tear capsulotomy. Hydrodissection was performed with balanced salt solution on a Fine canula. The lens nucleus was then removed using the phacoemulsification handpiece. Residual cortex was removed with the I&A handpiece. The anterior chamber and capsular bag were refilled with Provisc. A posterior chamber intraocular lens was placed into the capsular bag with it's injector. The implant was positioned with the Kuglan hook. The Provisc was then removed from the anterior chamber and capsular bag with the I&A handpiece. Stromal hydration of the main incision and paracentesis port was performed with BSS on a Fine canula. The wounds were tested for leak which was negative. The patient tolerated the procedure well. There were no operative complications. The patient was then transferred to the recovery room in stable condition.  Complications: None  Specimen: None  EBL: None  Prosthetic device: J&J Technis, PCB00, power 25.0, SN  [id number]

## 2017-03-24 NOTE — Anesthesia Postprocedure Evaluation (Signed)
Anesthesia Post Note  Patient: Linda Sherman  Procedure(s) Performed: CATARACT EXTRACTION PHACO AND INTRAOCULAR LENS PLACEMENT LEFT EYE (Left Eye)  Patient location during evaluation: Short Stay Anesthesia Type: MAC Level of consciousness: awake and alert Pain management: satisfactory to patient Vital Signs Assessment: post-procedure vital signs reviewed and stable Respiratory status: spontaneous breathing Cardiovascular status: stable Postop Assessment: no apparent nausea or vomiting Anesthetic complications: no     Last Vitals:  Vitals:   03/24/17 0830 03/24/17 0901  BP:  (!) 176/68  Pulse:  64  Resp: 11 16  Temp:  (!) 36.4 C  SpO2: 99% 97%    Last Pain:  Vitals:   03/24/17 0901  TempSrc: Oral                 Muslima Toppins

## 2017-03-25 ENCOUNTER — Encounter (HOSPITAL_COMMUNITY): Payer: Self-pay | Admitting: Ophthalmology

## 2017-04-21 DIAGNOSIS — E559 Vitamin D deficiency, unspecified: Secondary | ICD-10-CM | POA: Diagnosis not present

## 2017-04-21 DIAGNOSIS — I10 Essential (primary) hypertension: Secondary | ICD-10-CM | POA: Diagnosis not present

## 2017-04-21 DIAGNOSIS — E782 Mixed hyperlipidemia: Secondary | ICD-10-CM | POA: Diagnosis not present

## 2017-04-21 DIAGNOSIS — E039 Hypothyroidism, unspecified: Secondary | ICD-10-CM | POA: Diagnosis not present

## 2017-04-24 DIAGNOSIS — Z0001 Encounter for general adult medical examination with abnormal findings: Secondary | ICD-10-CM | POA: Diagnosis not present

## 2017-04-24 DIAGNOSIS — E039 Hypothyroidism, unspecified: Secondary | ICD-10-CM | POA: Diagnosis not present

## 2017-04-24 DIAGNOSIS — I10 Essential (primary) hypertension: Secondary | ICD-10-CM | POA: Diagnosis not present

## 2017-04-24 DIAGNOSIS — I5032 Chronic diastolic (congestive) heart failure: Secondary | ICD-10-CM | POA: Diagnosis not present

## 2017-04-24 DIAGNOSIS — M810 Age-related osteoporosis without current pathological fracture: Secondary | ICD-10-CM | POA: Diagnosis not present

## 2017-05-13 DIAGNOSIS — H11421 Conjunctival edema, right eye: Secondary | ICD-10-CM | POA: Diagnosis not present

## 2017-10-27 DIAGNOSIS — E782 Mixed hyperlipidemia: Secondary | ICD-10-CM | POA: Diagnosis not present

## 2017-10-27 DIAGNOSIS — E039 Hypothyroidism, unspecified: Secondary | ICD-10-CM | POA: Diagnosis not present

## 2017-10-27 DIAGNOSIS — I10 Essential (primary) hypertension: Secondary | ICD-10-CM | POA: Diagnosis not present

## 2017-10-30 DIAGNOSIS — E039 Hypothyroidism, unspecified: Secondary | ICD-10-CM | POA: Diagnosis not present

## 2017-10-30 DIAGNOSIS — I447 Left bundle-branch block, unspecified: Secondary | ICD-10-CM | POA: Diagnosis not present

## 2017-10-30 DIAGNOSIS — E782 Mixed hyperlipidemia: Secondary | ICD-10-CM | POA: Diagnosis not present

## 2017-10-30 DIAGNOSIS — I739 Peripheral vascular disease, unspecified: Secondary | ICD-10-CM | POA: Diagnosis not present

## 2017-11-18 DIAGNOSIS — Z6822 Body mass index (BMI) 22.0-22.9, adult: Secondary | ICD-10-CM | POA: Diagnosis not present

## 2017-11-18 DIAGNOSIS — I10 Essential (primary) hypertension: Secondary | ICD-10-CM | POA: Diagnosis not present

## 2017-11-18 DIAGNOSIS — E039 Hypothyroidism, unspecified: Secondary | ICD-10-CM | POA: Diagnosis not present

## 2017-11-18 DIAGNOSIS — I447 Left bundle-branch block, unspecified: Secondary | ICD-10-CM | POA: Diagnosis not present

## 2017-11-21 DIAGNOSIS — Z720 Tobacco use: Secondary | ICD-10-CM | POA: Diagnosis not present

## 2017-11-21 DIAGNOSIS — T679XXA Effect of heat and light, unspecified, initial encounter: Secondary | ICD-10-CM | POA: Diagnosis not present

## 2017-11-21 DIAGNOSIS — R531 Weakness: Secondary | ICD-10-CM | POA: Diagnosis not present

## 2017-11-21 DIAGNOSIS — Z79899 Other long term (current) drug therapy: Secondary | ICD-10-CM | POA: Diagnosis not present

## 2017-11-21 DIAGNOSIS — I10 Essential (primary) hypertension: Secondary | ICD-10-CM | POA: Diagnosis not present

## 2017-11-21 DIAGNOSIS — Z8673 Personal history of transient ischemic attack (TIA), and cerebral infarction without residual deficits: Secondary | ICD-10-CM | POA: Diagnosis not present

## 2017-11-21 DIAGNOSIS — I639 Cerebral infarction, unspecified: Secondary | ICD-10-CM | POA: Diagnosis not present

## 2017-11-21 DIAGNOSIS — R55 Syncope and collapse: Secondary | ICD-10-CM | POA: Diagnosis not present

## 2017-11-21 DIAGNOSIS — I6381 Other cerebral infarction due to occlusion or stenosis of small artery: Secondary | ICD-10-CM | POA: Diagnosis not present

## 2017-11-21 DIAGNOSIS — I959 Hypotension, unspecified: Secondary | ICD-10-CM | POA: Diagnosis not present

## 2017-11-22 ENCOUNTER — Other Ambulatory Visit (HOSPITAL_COMMUNITY): Payer: Medicare Other

## 2017-11-22 ENCOUNTER — Inpatient Hospital Stay (HOSPITAL_COMMUNITY): Payer: Medicare Other

## 2017-11-22 ENCOUNTER — Other Ambulatory Visit: Payer: Self-pay

## 2017-11-22 ENCOUNTER — Encounter (HOSPITAL_COMMUNITY): Payer: Self-pay

## 2017-11-22 ENCOUNTER — Inpatient Hospital Stay (HOSPITAL_COMMUNITY)
Admission: AD | Admit: 2017-11-22 | Discharge: 2017-11-24 | DRG: 312 | Disposition: A | Discharge status: Home / Self Care | Payer: Medicare Other | Source: Other Acute Inpatient Hospital | Attending: Emergency Medicine | Admitting: Emergency Medicine

## 2017-11-22 DIAGNOSIS — I5189 Other ill-defined heart diseases: Secondary | ICD-10-CM | POA: Diagnosis present

## 2017-11-22 DIAGNOSIS — E039 Hypothyroidism, unspecified: Secondary | ICD-10-CM | POA: Diagnosis not present

## 2017-11-22 DIAGNOSIS — I639 Cerebral infarction, unspecified: Secondary | ICD-10-CM | POA: Diagnosis not present

## 2017-11-22 DIAGNOSIS — J449 Chronic obstructive pulmonary disease, unspecified: Secondary | ICD-10-CM | POA: Diagnosis not present

## 2017-11-22 DIAGNOSIS — Z7982 Long term (current) use of aspirin: Secondary | ICD-10-CM

## 2017-11-22 DIAGNOSIS — R739 Hyperglycemia, unspecified: Secondary | ICD-10-CM | POA: Diagnosis not present

## 2017-11-22 DIAGNOSIS — I5030 Unspecified diastolic (congestive) heart failure: Secondary | ICD-10-CM

## 2017-11-22 DIAGNOSIS — Z79899 Other long term (current) drug therapy: Secondary | ICD-10-CM | POA: Diagnosis not present

## 2017-11-22 DIAGNOSIS — I472 Ventricular tachycardia: Secondary | ICD-10-CM | POA: Diagnosis not present

## 2017-11-22 DIAGNOSIS — R55 Syncope and collapse: Secondary | ICD-10-CM | POA: Diagnosis not present

## 2017-11-22 DIAGNOSIS — I739 Peripheral vascular disease, unspecified: Secondary | ICD-10-CM | POA: Diagnosis not present

## 2017-11-22 DIAGNOSIS — I081 Rheumatic disorders of both mitral and tricuspid valves: Secondary | ICD-10-CM | POA: Diagnosis present

## 2017-11-22 DIAGNOSIS — I10 Essential (primary) hypertension: Secondary | ICD-10-CM | POA: Diagnosis not present

## 2017-11-22 DIAGNOSIS — Z8673 Personal history of transient ischemic attack (TIA), and cerebral infarction without residual deficits: Secondary | ICD-10-CM | POA: Diagnosis not present

## 2017-11-22 DIAGNOSIS — Z888 Allergy status to other drugs, medicaments and biological substances status: Secondary | ICD-10-CM | POA: Diagnosis not present

## 2017-11-22 DIAGNOSIS — I447 Left bundle-branch block, unspecified: Secondary | ICD-10-CM | POA: Diagnosis present

## 2017-11-22 DIAGNOSIS — E785 Hyperlipidemia, unspecified: Secondary | ICD-10-CM | POA: Diagnosis not present

## 2017-11-22 DIAGNOSIS — I2510 Atherosclerotic heart disease of native coronary artery without angina pectoris: Secondary | ICD-10-CM | POA: Diagnosis not present

## 2017-11-22 DIAGNOSIS — F17210 Nicotine dependence, cigarettes, uncomplicated: Secondary | ICD-10-CM | POA: Diagnosis present

## 2017-11-22 LAB — GLUCOSE, CAPILLARY
GLUCOSE-CAPILLARY: 99 mg/dL (ref 70–99)
GLUCOSE-CAPILLARY: 110 mg/dL — AB (ref 70–99)
Glucose-Capillary: 106 mg/dL — ABNORMAL HIGH (ref 70–99)
Glucose-Capillary: 95 mg/dL (ref 70–99)
Glucose-Capillary: 95 mg/dL (ref 70–99)

## 2017-11-22 LAB — COMPREHENSIVE METABOLIC PANEL
ALK PHOS: 89 U/L (ref 38–126)
ALT: 11 U/L (ref 0–44)
AST: 17 U/L (ref 15–41)
Albumin: 3.3 g/dL — ABNORMAL LOW (ref 3.5–5.0)
Anion gap: 9 (ref 5–15)
BILIRUBIN TOTAL: 0.4 mg/dL (ref 0.3–1.2)
BUN: 14 mg/dL (ref 8–23)
CALCIUM: 9.1 mg/dL (ref 8.9–10.3)
CO2: 24 mmol/L (ref 22–32)
CREATININE: 0.88 mg/dL (ref 0.44–1.00)
Chloride: 107 mmol/L (ref 98–111)
GFR calc Af Amer: >60 mL/min (ref >60)
Glucose, Bld: 99 mg/dL (ref 70–99)
Potassium: 3.4 mmol/L — ABNORMAL LOW (ref 3.5–5.1)
Sodium: 140 mmol/L (ref 135–145)
Total Protein: 6.2 g/dL — ABNORMAL LOW (ref 6.5–8.1)

## 2017-11-22 LAB — CBC
HEMATOCRIT: 41.2 % (ref 36.0–46.0)
HEMOGLOBIN: 13.6 g/dL (ref 12.0–15.0)
MCH: 28.6 pg (ref 26.0–34.0)
MCHC: 33 g/dL (ref 30.0–36.0)
MCV: 86.6 fL (ref 80.0–100.0)
Platelets: 310 10*3/uL (ref 150–400)
RBC: 4.76 MIL/uL (ref 3.87–5.11)
RDW: 12.8 % (ref 11.5–15.5)
WBC: 10.7 10*3/uL — ABNORMAL HIGH (ref 4.0–10.5)
nRBC: 0 % (ref 0.0–0.2)

## 2017-11-22 LAB — TROPONIN I: Troponin I: <0.03 ng/mL (ref <0.03)

## 2017-11-22 LAB — ECHOCARDIOGRAM COMPLETE
Height: 60 in
Weight: 1890.66 oz

## 2017-11-22 LAB — LACTIC ACID, PLASMA: LACTIC ACID, VENOUS: 1.5 mmol/L (ref 0.5–1.9)

## 2017-11-22 LAB — MAGNESIUM: MAGNESIUM: 2.1 mg/dL (ref 1.7–2.4)

## 2017-11-22 LAB — HEMOGLOBIN A1C
HEMOGLOBIN A1C: 5.6 % (ref 4.8–5.6)
MEAN PLASMA GLUCOSE: 114.02 mg/dL

## 2017-11-22 LAB — PROCALCITONIN

## 2017-11-22 LAB — PHOSPHORUS: PHOSPHORUS: 4.4 mg/dL (ref 2.5–4.6)

## 2017-11-22 MED ORDER — LEVOTHYROXINE SODIUM 50 MCG PO TABS
50.0000 ug | ORAL_TABLET | Freq: Every day | ORAL | Status: DC
  Administered 2017-11-22 – 2017-11-24 (×3): 50 ug via ORAL
  Filled 2017-11-22 (×3): qty 1

## 2017-11-22 MED ORDER — VITAMIN D 1000 UNITS PO TABS
1000.0000 [IU] | ORAL_TABLET | Freq: Every day | ORAL | Status: DC
  Administered 2017-11-22 – 2017-11-24 (×3): 1000 [IU] via ORAL
  Filled 2017-11-22 (×4): qty 1

## 2017-11-22 MED ORDER — ASPIRIN EC 81 MG PO TBEC
81.0000 mg | DELAYED_RELEASE_TABLET | Freq: Every day | ORAL | Status: DC
  Administered 2017-11-22 – 2017-11-24 (×3): 81 mg via ORAL
  Filled 2017-11-22 (×3): qty 1

## 2017-11-22 MED ORDER — HEPARIN SODIUM (PORCINE) 5000 UNIT/ML IJ SOLN
5000.0000 [IU] | Freq: Three times a day (TID) | INTRAMUSCULAR | Status: DC
Start: 2017-11-22 — End: 2017-11-24
  Administered 2017-11-22 – 2017-11-23 (×3): 5000 [IU] via SUBCUTANEOUS
  Filled 2017-11-22 (×5): qty 1

## 2017-11-22 MED ORDER — METOPROLOL SUCCINATE ER 100 MG PO TB24
100.0000 mg | ORAL_TABLET | Freq: Every day | ORAL | Status: DC
  Administered 2017-11-22 – 2017-11-24 (×3): 100 mg via ORAL
  Filled 2017-11-22 (×3): qty 1

## 2017-11-22 MED ORDER — AMLODIPINE BESYLATE 10 MG PO TABS
10.0000 mg | ORAL_TABLET | Freq: Every day | ORAL | Status: DC
  Administered 2017-11-22 – 2017-11-24 (×3): 10 mg via ORAL
  Filled 2017-11-22 (×3): qty 1

## 2017-11-22 MED ORDER — LORATADINE 10 MG PO TABS: 10.0000 mg | ORAL_TABLET | Freq: Every day | ORAL | Status: DC | PRN

## 2017-11-22 NOTE — Progress Notes (Signed)
NAME:  Linda Sherman, MRN:  [id number], DOB:  05-Jul-1940, LOS: 0 ADMISSION DATE:  11/22/2017, CONSULTATION DATE:  11/22/2017 REFERRING MD:  Lanier Prude, CHIEF COMPLAINT:  Syncope    History of Present Illness   77 year old Linda presents to OSH ED on 10/11. Per Family patient was at [facility] when she became Hot/Flushed and then passed out. When EMS arrived patient was unresponsive with BP 198/102, after about 5 minutes patient spontaneously was back to baseline. On arrival to ED, patient has no neurological deficits. EKG noted with LBBB, however per previous documentation this was also noted on EKG in 2015. Troponin <0.1, WBC 10.2, U/A negative. CT Head with age indeterminate lacunar infarct to left pons and unchanged lacunar infarct to right caudate head.    Per patient she went to PCP earlier this week who added clonidine to her medication regimen, however, patient states she has not started this as of yet.    Past Medical History  HTN, CAD, Thyroid Disease, H/O Tobacco Use (30 pack year history)   Significant Hospital Events   10/11 > Presents to OSH 10/12 > Transferred to [facility]: date of consult/date signed off & final recs:  PCCM 10/12   Procedures (surgical and bedside):  N/A   Significant Diagnostic Tests:  CT Head 10/11 > Age Indeterminate lacunar infarct in the left pons, unchanged lacunar infarct in the right caudate head, no evidence of hemorrhage, hydrocephalus, extra-axial collection or mass lesion/mass effect  ECHO 10/12 >> MRI Brain 10/12 >> neg for acute infarct , atrophy/chronic ischemic changes   Micro Data:  N/A   Antimicrobials:  N/A    Subjective:  Feels better , no more episodes of presyncope/syncope  Head feels flushed.  Objective   Blood pressure (!) 157/74, pulse 65, temperature 97.6 F (36.4 C), temperature source Oral, resp. rate 18, height 5' (1.524 m), weight 53.6 kg, SpO2 97 %.       No intake or output data in the 24  hours ending 11/22/17 1246 Filed Weights   11/22/17 0112  Weight: 53.6 kg    Examination: General: Elderly Linda, no distress  HENT: MMM Lungs: Clear breath sounds, non-labored  Cardiovascular: Bradycardia  no MRG Abdomen: Non-distended, active bowel sounds  Extremities: -edema  Neuro: Alert, oriented x 3 , maewx 4  GU: Intact   Resolved Hospital Problem list     Assessment & Plan:   Syncopal Episode of unclear etiology, concern stress related as patient was at funeral during event  HTN -EKG with Known LBBB -Troponin <0.01  -Orthostatic Vitals negative  H/O CAD  Plan  -Cardiac Monitoring -Maintain Systolic <160 -Continue home Metoprolol, Norvasc, ASA  -ECHO pending  Hold clonidine   Indeterminate age lacunar infarct to left pons and old infarct to right caudate head  -No neurological deficits  MRI 10/12 neg for acute .  Plan  - monitor  -cont ASA   Hyperglycemia Plan -Trend Glucose > if remains elevated will add SSI  -Hemoglobin A1C  (5.6)   Disposition / Summary of Today's Plan 11/22/17   F/up Echo     Diet: Regular  DVT prophylaxis: Heparin  Hyperglycemia protocol-n/a  Mobility: Bedrest  Code Status: Full Code  Family Communication: Family updated at bedside   Labs   CBC: Recent Labs  Lab 11/22/17 0252  WBC 10.7*  HGB 13.6  HCT 41.2  MCV 86.6  PLT 310    Basic Metabolic Panel: Recent Labs  Lab 11/22/17 0252  NA 140  K 3.4*  CL 107  CO2 24  GLUCOSE 99  BUN 14  CREATININE 0.88  CALCIUM 9.1  MG 2.1  PHOS 4.4   GFR: Estimated Creatinine Clearance: 39.7 mL/min (by C-G formula based on SCr of 0.88 mg/dL). Recent Labs  Lab 11/22/17 0252  PROCALCITON <0.10  WBC 10.7*  LATICACIDVEN 1.5    Liver Function Tests: Recent Labs  Lab 11/22/17 0252  AST 17  ALT 11  ALKPHOS 89  BILITOT 0.4  PROT 6.2*  ALBUMIN 3.3*   No results for input(s): LIPASE, AMYLASE in the last 168 hours. No results for input(s): AMMONIA in the last 168  hours.  ABG No results found for: PHART, PCO2ART, PO2ART, HCO3, TCO2, ACIDBASEDEF, O2SAT   Coagulation Profile: No results for input(s): INR, PROTIME in the last 168 hours.  Cardiac Enzymes: No results for input(s): CKTOTAL, CKMB, CKMBINDEX, TROPONINI in the last 168 hours.  HbA1C: Hgb A1c MFr Bld  Date/Time Value Ref Range Status  11/22/2017 02:52 AM 5.6 4.8 - 5.6 % Final    Comment:    (NOTE) Pre diabetes:          5.7%-6.4% Diabetes:              >6.4% Glycemic control for   <7.0% adults with diabetes     CBG: Recent Labs  Lab 11/22/17 0357 11/22/17 0751 11/22/17 1128  GLUCAP 106* 95 95    Admitting History of Present Illness.   As above    Karim Aiello NP-C  [facility]  [phone]

## 2017-11-22 NOTE — Progress Notes (Signed)
  Echocardiogram 2D Echocardiogram has been performed.  Tye Savoy 11/22/2017, 3:46 PM

## 2017-11-22 NOTE — H&P (Signed)
NAME:  Linda Sherman, MRN:  [id number], DOB:  27-Jan-1941, LOS: 0 ADMISSION DATE:  11/22/2017, CONSULTATION DATE:  11/22/2017 REFERRING MD:  Lanier Prude, CHIEF COMPLAINT:  Syncope    History of Present Illness   77 year old female presents to OSH ED on 10/11. Per Family patient was at [facility] when she became Hot/Flushed and then passed out. When EMS arrived patient was unresponsive with BP 198/102, after about 5 minutes patient spontaneously was back to baseline. On arrival to ED, patient has no neurological deficits. EKG noted with LBBB, however per previous documentation this was also noted on EKG in 2015. Troponin <0.1, WBC 10.2, U/A negative. CT Head with age indeterminate lacunar infarct to left pons and unchanged lacunar infarct to right caudate head.    Per patient she went to PCP earlier this week who added clonidine to her medication regimen, however, patient states she has not started this as of yet.    Past Medical History  HTN, CAD, Thyroid Disease, H/O Tobacco Use (30 pack year history)   Significant Hospital Events   10/11 > Presents to OSH 10/12 > Transferred to [facility]: date of consult/date signed off & final recs:  PCCM 10/12   Procedures (surgical and bedside):  N/A   Significant Diagnostic Tests:  CT Head 10/11 > Age Indeterminate lacunar infarct in the left pons, unchanged lacunar infarct in the right caudate head, no evidence of hemorrhage, hydrocephalus, extra-axial collection or mass lesion/mass effect  ECHO 10/12 >> MRI Brain 10/12 >>   Micro Data:  N/A   Antimicrobials:  N/A    Subjective:  Feels like she is back to her baseline   Objective   Temperature 98 F (36.7 C), temperature source Oral, height 5' (1.524 m), weight 53.6 kg.       No intake or output data in the 24 hours ending 11/22/17 0138 Filed Weights   11/22/17 0112  Weight: 53.6 kg    Examination: General: Elderly female, no distress  HENT: MMM Lungs: Clear  breath sounds, non-labored  Cardiovascular: RRR, no MRG Abdomen: Non-distended, active bowel sounds  Extremities: -edema  Neuro: Alert, oriented, follows commands, pupils intact, RUE/LUE/LLE/RLE 5/5 GU: Intact   Resolved Hospital Problem list     Assessment & Plan:   Syncopal Episode of unclear etiology, concern stress related as patient was at funeral during event  HTN -EKG with Known LBBB -Troponin <0.01  -Orthostatic Vitals negative  H/O CAD  Plan  -Cardiac Monitoring -Maintain Systolic <160 -Continue home Metoprolol, Norvasc, ASA  -ECHO pending   Indeterminate age lacunar infarct to left pons and old infarct to right caudate head  -No neurological deficits  Plan  -Neurology requests MRI, Advised to consult if abnormal  -Neuro Exams Q4H   Hyperglycemia Plan -Trend Glucose > if remains elevated will add SSI  -Hemoglobin A1C    Disposition / Summary of Today's Plan 11/22/17   MRI Pending. F/U Results.     Diet: Regular  DVT prophylaxis: Heparin  Hyperglycemia protocol Mobility: Bedrest  Code Status: Full Code  Family Communication: Family updated at bedside   Labs   CBC: No results for input(s): WBC, NEUTROABS, HGB, HCT, MCV, PLT in the last 168 hours.  Basic Metabolic Panel: No results for input(s): NA, K, CL, CO2, GLUCOSE, BUN, CREATININE, CALCIUM, MG, PHOS in the last 168 hours. GFR: CrCl cannot be calculated (Patient's most recent lab result is older than the maximum 21 days allowed.). No results for input(s):  PROCALCITON, WBC, LATICACIDVEN in the last 168 hours.  Liver Function Tests: No results for input(s): AST, ALT, ALKPHOS, BILITOT, PROT, ALBUMIN in the last 168 hours. No results for input(s): LIPASE, AMYLASE in the last 168 hours. No results for input(s): AMMONIA in the last 168 hours.  ABG No results found for: PHART, PCO2ART, PO2ART, HCO3, TCO2, ACIDBASEDEF, O2SAT   Coagulation Profile: No results for input(s): INR, PROTIME in the last 168  hours.  Cardiac Enzymes: No results for input(s): CKTOTAL, CKMB, CKMBINDEX, TROPONINI in the last 168 hours.  HbA1C: No results found for: HGBA1C  CBG: No results for input(s): GLUCAP in the last 168 hours.  Admitting History of Present Illness.   As above   Review of Systems:   All negative; except for those that are bolded, which indicate positives.  Constitutional: weight loss, weight gain, night sweats, fevers, chills, fatigue, weakness.  HEENT: headaches, sore throat, sneezing, nasal congestion, post nasal drip, difficulty swallowing, tooth/dental problems, visual complaints, visual changes, ear aches. Neuro: difficulty with speech, weakness, numbness, ataxia. CV:  chest pain, orthopnea, PND, swelling in lower extremities, dizziness, palpitations, syncope.  Resp: cough, hemoptysis, dyspnea, wheezing. GI: heartburn, indigestion, abdominal pain, nausea, vomiting, diarrhea, constipation, change in bowel habits, loss of appetite, hematemesis, melena, hematochezia.  GU: dysuria, change in color of urine, urgency or frequency, flank pain, hematuria. MSK: joint pain or swelling, decreased range of motion. Psych: change in mood or affect, depression, anxiety, suicidal ideations, homicidal ideations. Skin: rash, itching, bruising.   Past Medical History  She,  has a past medical history of Anxiety state, unspecified, Arthritis, Chest pain, unspecified, Chronic airway obstruction, not elsewhere classified, Coronary artery disease (2008), Dyslipidemia, Hypothyroidism, Intermediate coronary syndrome (HCC), Left ventricular dysfunction with preserved left ventricular function, Other malaise and fatigue, Palpitations, Peripheral vascular disease, unspecified (HCC), Shortness of breath, Tobacco use disorder, and Unspecified essential hypertension.   Surgical History    Past Surgical History:  Procedure Laterality Date  . ABDOMINAL HYSTERECTOMY    . APPENDECTOMY    . CATARACT EXTRACTION  W/PHACO Right 03/10/2017   Procedure: CATARACT EXTRACTION PHACO AND INTRAOCULAR LENS PLACEMENT RIGHT EYE;  Surgeon: Gemma Payor, MD;  Location: [facility];  Service: Ophthalmology;  Laterality: Right;  CDE: 10.22  . CATARACT EXTRACTION W/PHACO Left 03/24/2017   Procedure: CATARACT EXTRACTION PHACO AND INTRAOCULAR LENS PLACEMENT LEFT EYE;  Surgeon: Gemma Payor, MD;  Location: [facility];  Service: Ophthalmology;  Laterality: Left;  CDE: 9.18     Social History   Social History   Socioeconomic History  . Marital status: Married    Spouse name: WAYNE  . Number of children: Not on file  . Years of education: Not on file  . Highest education level: Not on file  Occupational History  . Occupation: RETIRED    Comment: [employer]  Social Needs  . Financial resource strain: Not on file  . Food insecurity:    Worry: Not on file    Inability: Not on file  . Transportation needs:    Medical: Not on file    Non-medical: Not on file  Tobacco Use  . Smoking status: Current Every Day Smoker    Packs/day: 0.50    Years: 47.00    Pack years: 23.50    Types: Cigarettes  . Smokeless tobacco: Never Used  . Tobacco comment: Started smoking at age 3  Substance and Sexual Activity  . Alcohol use: No  . Drug use: No  . Sexual activity: Yes  Birth control/protection: Surgical  Lifestyle  . Physical activity:    Days per week: Not on file    Minutes per session: Not on file  . Stress: Not on file  Relationships  . Social connections:    Talks on phone: Not on file    Gets together: Not on file    Attends religious service: Not on file    Active member of club or organization: Not on file    Attends meetings of clubs or organizations: Not on file    Relationship status: Not on file  . Intimate partner violence:    Fear of current or ex partner: Not on file    Emotionally abused: Not on file    Physically abused: Not on file    Forced sexual activity: Not on file  Other Topics Concern  .  Not on file  Social History Narrative   patient lives in [place] she is the mother Efrain Sella, who used to work in our office  ,  reports that she has been smoking cigarettes. She has a 23.50 pack-year smoking history. She has never used smokeless tobacco. She reports that she does not drink alcohol or use drugs.   Family History   Her family history includes Heart attack (age of onset: 59) in her sister.   Allergies Allergies  Allergen Reactions  . Other Other (See Comments)    seldena     Home Medications  Prior to Admission medications   Medication Sig Start Date End Date Taking? Authorizing Provider  aspirin EC 81 MG tablet Take 81 mg by mouth daily.    [provider]  cholecalciferol (VITAMIN D) 1000 units tablet Take 1,000 Units by mouth daily.    [provider]  Cyanocobalamin (VITAMIN B-12 PO) Take 1 tablet by mouth daily.    [provider]  levothyroxine (SYNTHROID, LEVOTHROID) 50 MCG tablet Take 50 mcg by mouth daily before breakfast.    [provider]  loratadine (CLARITIN) 10 MG tablet Take 10 mg by mouth daily as needed for allergies.     [provider]  metoprolol succinate (TOPROL-XL) 100 MG 24 hr tablet Take 100 mg by mouth daily.     [provider]     Jovita Kussmaul, AGACNP-BC [facility]  PCCM Pgr: [phone]

## 2017-11-22 NOTE — Progress Notes (Signed)
Received pt on stretcher via carelink, alert and oriented, completed assessments, daughter at the bedside, initiated Tele monitor and verified, 1 person assist, call light in reach

## 2017-11-23 LAB — GLUCOSE, CAPILLARY
GLUCOSE-CAPILLARY: 111 mg/dL — AB (ref 70–99)
GLUCOSE-CAPILLARY: 84 mg/dL (ref 70–99)
GLUCOSE-CAPILLARY: 98 mg/dL (ref 70–99)
Glucose-Capillary: 99 mg/dL (ref 70–99)
Glucose-Capillary: 73 mg/dL (ref 70–99)

## 2017-11-23 LAB — BASIC METABOLIC PANEL
Anion gap: 10 (ref 5–15)
BUN: 16 mg/dL (ref 8–23)
CHLORIDE: 107 mmol/L (ref 98–111)
CO2: 26 mmol/L (ref 22–32)
CREATININE: 0.81 mg/dL (ref 0.44–1.00)
Calcium: 9.3 mg/dL (ref 8.9–10.3)
GFR calc non Af Amer: >60 mL/min (ref >60)
Glucose, Bld: 104 mg/dL — ABNORMAL HIGH (ref 70–99)
POTASSIUM: 3.7 mmol/L (ref 3.5–5.1)
SODIUM: 143 mmol/L (ref 135–145)

## 2017-11-23 LAB — CBC
HEMATOCRIT: 45.1 % (ref 36.0–46.0)
HEMOGLOBIN: 14.6 g/dL (ref 12.0–15.0)
MCH: 28.3 pg (ref 26.0–34.0)
MCHC: 32.4 g/dL (ref 30.0–36.0)
MCV: 87.6 fL (ref 80.0–100.0)
Platelets: 306 10*3/uL (ref 150–400)
RBC: 5.15 MIL/uL — ABNORMAL HIGH (ref 3.87–5.11)
RDW: 12.9 % (ref 11.5–15.5)
WBC: 8.5 10*3/uL (ref 4.0–10.5)
nRBC: 0 % (ref 0.0–0.2)

## 2017-11-23 MED ORDER — LORATADINE 10 MG PO TABS
10.0000 mg | ORAL_TABLET | Freq: Every day | ORAL | Status: DC | PRN
  Administered 2017-11-23: 10 mg via ORAL
  Filled 2017-11-23: qty 1

## 2017-11-23 NOTE — Consult Note (Signed)
CARDIOLOGY CONSULT NOTE  Patient ID: Linda Sherman, MRN: [id number], DOB/AGE: [birth year] 75 y.o. Admit date: 11/22/2017 Date of Consult: 11/23/2017  Primary Physician: Juliette Alcide, MD Primary Cardiologist: Herb Grays Kerin is a 77 y.o. female who is being seen today for the evaluation of syncoep  at the request of Dr Delton Coombes.    Chief Complaint: syncope   HPI Linda Sherman is a 77 y.o. female admitted with a syncopal episode occurring while preparing food for a funeral.  She was in a hot kitchen cutting potatoes.  She had a sense of warmth and impending syncope.  She mentioned it to her colleagues, this being something she does not recall, and then lost consciousness.  Estimated time unconscious was 5-15 minutes.  On arrival of EMS reportedly her blood pressure was high.  The family does not recall being told that she was pale.  Clonidine has been added to her antihypertensive regime earlier in the week.  She has had a sensation of warmth and flushing with standing since the initiation of this medication.  She has no antecedent history of syncope.  She does have a history of left bundle branch block (see under ECGs below)  Has chronic dyspnea on exertion but no chest discomfort.  Apparently per notes has a prior negative catheterization ([facility] note 2015) she however has no recollection.  She has no history of palpitations.  echocardiogram 10/19 LVEF normal    Past Medical History:  Diagnosis Date  . Anxiety state, unspecified   . Arthritis   . Chest pain, unspecified   . Chronic airway obstruction, not elsewhere classified   . Coronary artery disease 2008   cardiac catheterization  . Dyslipidemia   . Hypothyroidism   . Intermediate coronary syndrome (HCC)   . Left ventricular dysfunction with preserved left ventricular function   . Other malaise and fatigue   . Palpitations   . Peripheral vascular disease, unspecified (HCC)   . Shortness of breath     . Tobacco use disorder   . Unspecified essential hypertension       Surgical History:  Past Surgical History:  Procedure Laterality Date  . ABDOMINAL HYSTERECTOMY    . APPENDECTOMY    . CATARACT EXTRACTION W/PHACO Right 03/10/2017   Procedure: CATARACT EXTRACTION PHACO AND INTRAOCULAR LENS PLACEMENT RIGHT EYE;  Surgeon: Gemma Payor, MD;  Location: [facility];  Service: Ophthalmology;  Laterality: Right;  CDE: 10.22  . CATARACT EXTRACTION W/PHACO Left 03/24/2017   Procedure: CATARACT EXTRACTION PHACO AND INTRAOCULAR LENS PLACEMENT LEFT EYE;  Surgeon: Gemma Payor, MD;  Location: [facility];  Service: Ophthalmology;  Laterality: Left;  CDE: 9.18     Home Meds: Prior to Admission medications   Medication Sig Start Date End Date Taking? Authorizing Provider  aspirin EC 81 MG tablet Take 81 mg by mouth daily.    [provider]  cholecalciferol (VITAMIN D) 1000 units tablet Take 1,000 Units by mouth daily.    [provider]  Cyanocobalamin (VITAMIN B-12 PO) Take 1 tablet by mouth daily.    [provider]  levothyroxine (SYNTHROID, LEVOTHROID) 50 MCG tablet Take 50 mcg by mouth daily before breakfast.    [provider]  loratadine (CLARITIN) 10 MG tablet Take 10 mg by mouth daily as needed for allergies.     [provider]  metoprolol succinate (TOPROL-XL) 100 MG 24 hr tablet Take 100 mg by mouth daily.     [provider]  Inpatient Medications:  . amLODipine  10 mg Oral Daily  . aspirin EC  81 mg Oral Daily  . cholecalciferol  1,000 Units Oral Daily  . heparin  5,000 Units Subcutaneous Q8H  . levothyroxine  50 mcg Oral QAC breakfast  . metoprolol succinate  100 mg Oral Daily    Allergies:  Allergies  Allergen Reactions  . Other Other (See Comments)    seldena    Social History   Socioeconomic History  . Marital status: Married    Spouse name: WAYNE  . Number of children: Not on file  . Years of education: Not on file  .  Highest education level: Not on file  Occupational History  . Occupation: RETIRED    Comment: [employer]  Social Needs  . Financial resource strain: Not on file  . Food insecurity:    Worry: Not on file    Inability: Not on file  . Transportation needs:    Medical: Not on file    Non-medical: Not on file  Tobacco Use  . Smoking status: Current Every Day Smoker    Packs/day: 0.50    Years: 47.00    Pack years: 23.50    Types: Cigarettes  . Smokeless tobacco: Never Used  . Tobacco comment: Started smoking at age 67  Substance and Sexual Activity  . Alcohol use: No  . Drug use: No  . Sexual activity: Yes    Birth control/protection: Surgical  Lifestyle  . Physical activity:    Days per week: Not on file    Minutes per session: Not on file  . Stress: Not on file  Relationships  . Social connections:    Talks on phone: Not on file    Gets together: Not on file    Attends religious service: Not on file    Active member of club or organization: Not on file    Attends meetings of clubs or organizations: Not on file    Relationship status: Not on file  . Intimate partner violence:    Fear of current or ex partner: Not on file    Emotionally abused: Not on file    Physically abused: Not on file    Forced sexual activity: Not on file  Other Topics Concern  . Not on file  Social History Narrative   patient lives in [place] she is the mother Efrain Sella, who used to work in our office     Family History  Problem Relation Age of Onset  . Heart attack Sister 73       MI     ROS:  Please see the history of present illness.     All other systems reviewed and negative.    Physical Exam: Blood pressure (!) 148/78, pulse 66, temperature 97.8 F (36.6 C), temperature source Oral, resp. rate 17, height 5' (1.524 m), weight 53.6 kg, SpO2 97 %. General: Well developed, well nourished female in no acute distress. Head: Normocephalic, atraumatic, sclera non-icteric, no xanthomas,  nares are without discharge. EENT: normal  Lymph Nodes:  none Neck: Negative for carotid bruits. JVD not elevated carotid sinus massage was negative. Back:without scoliosis kyphosis  Lungs: Clear bilaterally to auscultation without wheezes, rales, or rhonchi. Breathing is unlabored. Heart: RRR with S1 S2. No murmur . No rubs, or gallops appreciated. Abdomen: Soft, non-tender, non-distended with normoactive bowel sounds. No hepatomegaly. No rebound/guarding. No obvious abdominal masses. Msk:  Strength and tone appear normal for age. Extremities: No clubbing or cyanosis. No  edema.  Distal pedal pulses are 2+ and equal bilaterally. Skin: Warm and Dry Neuro: Alert and oriented X 3. CN III-XII intact Grossly normal sensory and motor function . Psych:  Responds to questions appropriately with a normal affect.      Labs: Chemistry Recent Labs  Lab 11/22/17 0252 11/23/17 0656  NA 140 143  K 3.4* 3.7  CL 107 107  CO2 24 26  GLUCOSE 99 104*  BUN 14 16  CREATININE 0.88 0.81  CALCIUM 9.1 9.3  PROT 6.2*  --   ALBUMIN 3.3*  --   AST 17  --   ALT 11  --   ALKPHOS 89  --   BILITOT 0.4  --   GFRNONAA >60 >60  GFRAA >60 >60  ANIONGAP 9 10     Hematology Recent Labs  Lab 11/22/17 0252 11/23/17 0656  WBC 10.7* 8.5  RBC 4.76 5.15*  HGB 13.6 14.6  HCT 41.2 45.1  MCV 86.6 87.6  MCH 28.6 28.3  MCHC 33.0 32.4  RDW 12.8 12.9  PLT 310 306    Cardiac Enzymes Recent Labs  Lab 11/22/17 1500  TROPONINI <0.03   No results for input(s): TROPIPOC in the last 168 hours.   BNPNo results for input(s): BNP, PROBNP in the last 168 hours.   DDimer No results for input(s): DDIMER in the last 168 hours.   Lab Results  Component Value Date   CHOL (H) 10/02/2006    251        ATP III CLASSIFICATION:  <200     mg/dL   Desirable  161-096  mg/dL   Borderline High  >=045    mg/dL   High   HDL 37 (L) 40/98/1191   LDLCALC (H) 10/02/2006    165        Total Cholesterol/HDL:CHD  Risk Coronary Heart Disease Risk Table                     Men   Women  1/2 Average Risk   3.4   3.3   TRIG 246 (H) 10/02/2006    Thyroid Function Tests: No results for input(s): TSH, T4TOTAL, T3FREE, THYROIDAB in the last 72 hours.  Invalid input(s): FREET3 Miscellaneous No results found for: DDIMER  Radiology/Studies:  Mr Brain Wo Contrast  Result Date: 11/22/2017 CLINICAL DATA:  Stroke. EXAM: MRI HEAD WITHOUT CONTRAST TECHNIQUE: Multiplanar, multiecho pulse sequences of the brain and surrounding structures were obtained without intravenous contrast. COMPARISON:  CT head 11/21/2017 FINDINGS: Brain: Negative for acute infarct. Extensive chronic microvascular ischemic changes. Chronic ischemia throughout the white matter bilaterally. Chronic infarcts in the internal capsule bilaterally. Chronic lacunar infarct in the left pons. Small chronic infarct right pons. Small chronic infarct right thalamus. Negative for hemorrhage or mass or midline shift. Vascular: Normal arterial flow voids Skull and upper cervical spine: Negative Sinuses/Orbits: Mild mucosal edema paranasal sinuses. Bilateral cataract surgery Other: None IMPRESSION: Negative for acute infarct. Atrophy and chronic ischemic changes as above. Electronically Signed   By: Marlan Palau M.D.   On: 11/22/2017 14:24   Dg Chest Port 1 View  Result Date: 11/22/2017 CLINICAL DATA:  Syncope EXAM: PORTABLE CHEST 1 VIEW COMPARISON:  Chest x-ray dated 11/21/2017. FINDINGS: Heart size and mediastinal contours are within normal limits. Lungs are clear. No pleural effusion or pneumothorax seen. No acute or suspicious osseous finding. IMPRESSION: No active disease.  No evidence of pneumonia or pulmonary edema. Electronically Signed   By: Bary Richard  M.D.   On: 11/22/2017 17:59    EKG:  10/19 sinus with left bundle branch block  Interval 16/13/44 1/19 sinus with left axis deviation  Intervals 17/11/44 Note from [place] ED 8/15: Chronic left  bundle branch block " 11/12 sinus with normal intervals and normal axis  Assessment and Plan:  Syncope probably neurally mediated  Left bundle branch block  Antecedent lacunar stroke  Hypertension  Cigarette abuse   The patient has an episode of syncope in the context of borderline left bundle branch block.  The circumstances of the event typically with the antecedent similar prodrome since initiation of clonidine and the duration of the event making it almost certainly a neurally mediated one.  This is true even, not withstanding the borderline left bundle branch block.  The episode was abrupt in onset but rather gradual offset and prolonged duration.  While intermittent bundle branch block might be expected to be abrupt in onset is also expected to be abrupt and offset in the history is less inconsistent with that.  My point of view it is reasonable for her to go home.  In [place], driving is restricted for 6 weeks following an episode of neurally mediated syncope.  In the event that she would have another episode  of syncope, would recommend the implantation of a loop recorder if the diagnosis is not clear.  We will order orthostatic vital signs.  She can follow-up with her primary care physicians.  If she desires [facility] heart follow-up this can be arranged in.  Sherryl Manges

## 2017-11-23 NOTE — Progress Notes (Signed)
Educated patient on heparin, refused medication.

## 2017-11-23 NOTE — Progress Notes (Signed)
NAME:  Linda Sherman, MRN:  [id number], DOB:  02/09/1941, LOS: 1 ADMISSION DATE:  11/22/2017, CONSULTATION DATE:  11/22/2017 REFERRING MD:  Lanier Prude, CHIEF COMPLAINT:  Syncope    History of Present Illness   77 year old female presents to OSH ED on 10/11. Per Family patient was at [facility] when she became Hot/Flushed and then passed out. When EMS arrived patient was unresponsive with BP 198/102, after about 5 minutes patient spontaneously was back to baseline. On arrival to ED, patient has no neurological deficits. EKG noted with LBBB, however per previous documentation this was also noted on EKG in 2015. Troponin <0.1, WBC 10.2, U/A negative. CT Head with age indeterminate lacunar infarct to left pons and unchanged lacunar infarct to right caudate head.    Per patient she went to PCP earlier this week who added clonidine to her medication regimen, however, patient states she has not started this as of yet.    Past Medical History  HTN, CAD, Thyroid Disease, H/O Tobacco Use (30 pack year history)   Significant Hospital Events   10/11 > Presents to OSH 10/12 > Transferred to [facility]: date of consult/date signed off & final recs:  PCCM 10/12   Procedures (surgical and bedside):  N/A   Significant Diagnostic Tests:  CT Head 10/11 > Age Indeterminate lacunar infarct in the left pons, unchanged lacunar infarct in the right caudate head, no evidence of hemorrhage, hydrocephalus, extra-axial collection or mass lesion/mass effect  ECHO 10/12 >> EF 60 to 65%, grade 1 diastolic dysfunction, trivial MV/TV regurg, PA P 30 mmHg MRI Brain 10/12 >> neg for acute infarct , atrophy/chronic ischemic changes   Micro Data:  N/A   Antimicrobials:  N/A    Subjective:  Feels better ,  Head still feels flushed.  Chronic cough with eating, PCP aware. Pt has cleft palate -open-palate appliance in place.  Discussed test results with pt /family .   Objective   Blood pressure (!)  148/78, pulse 64, temperature 97.6 F (36.4 C), temperature source Oral, resp. rate 17, height 5' (1.524 m), weight 53.6 kg, SpO2 95 %.       No intake or output data in the 24 hours ending 11/23/17 1142 Filed Weights   11/22/17 0112  Weight: 53.6 kg    Examination: General: Elderly female in no distress   HENT: AT/[place] Lungs: Clear to auscultation bilaterally   Cardiovascular: Bradycardia, no MRG Abdomen: Soft nontender positive bowel sounds Extremities: No edema  Neuro: Alert oriented x3 moves all extremities well   GU: Intact    Resolved Hospital Problem list     Assessment & Plan:   Syncopal Episode of unclear etiology, concern stress related as patient was cooking in kitchen  during event  HTN -EKG with LBBB -Troponin <0.01  -Orthostatic Vitals negative  -Echo EF 60 to 65%, grade 1 diastolic dysfunction, trivial regurgitation MV/TV  PAP 30 mmHg -consult Cards   H/O CAD  Plan  -Cardiac Monitoring -Maintain Systolic <160 -Continue home Metoprolol, Norvasc, ASA  -Hold clonidine  -consult cards   Indeterminate age lacunar infarct to left pons and old infarct to right caudate head  -No neurological deficits  MRI 10/12 neg for acute .  Plan  - monitor  -cont ASA  -Discuss with PCP on outpatient follow-up  Hyperglycemia-resolved  Plan -Trend Glucose > if remains elevated will add SSI  -Hemoglobin A1C  (5.6)   Disposition / Summary of Today's Plan 11/23/17   Consult  cardiology    Diet: Regular  DVT prophylaxis: Heparin  Hyperglycemia protocol-n/a  Mobility: Bedrest  Code Status: Full Code  Family Communication: Family updated at bedside   Labs   CBC: Recent Labs  Lab 11/22/17 0252 11/23/17 0656  WBC 10.7* 8.5  HGB 13.6 14.6  HCT 41.2 45.1  MCV 86.6 87.6  PLT 310 306    Basic Metabolic Panel: Recent Labs  Lab 11/22/17 0252 11/23/17 0656  NA 140 143  K 3.4* 3.7  CL 107 107  CO2 24 26  GLUCOSE 99 104*  BUN 14 16  CREATININE 0.88 0.81    CALCIUM 9.1 9.3  MG 2.1  --   PHOS 4.4  --    GFR: Estimated Creatinine Clearance: 43.1 mL/min (by C-G formula based on SCr of 0.81 mg/dL). Recent Labs  Lab 11/22/17 0252 11/23/17 0656  PROCALCITON <0.10  --   WBC 10.7* 8.5  LATICACIDVEN 1.5  --     Liver Function Tests: Recent Labs  Lab 11/22/17 0252  AST 17  ALT 11  ALKPHOS 89  BILITOT 0.4  PROT 6.2*  ALBUMIN 3.3*   No results for input(s): LIPASE, AMYLASE in the last 168 hours. No results for input(s): AMMONIA in the last 168 hours.  ABG No results found for: PHART, PCO2ART, PO2ART, HCO3, TCO2, ACIDBASEDEF, O2SAT   Coagulation Profile: No results for input(s): INR, PROTIME in the last 168 hours.  Cardiac Enzymes: Recent Labs  Lab 11/22/17 1500  TROPONINI <0.03    HbA1C: Hgb A1c MFr Bld  Date/Time Value Ref Range Status  11/22/2017 02:52 AM 5.6 4.8 - 5.6 % Final    Comment:    (NOTE) Pre diabetes:          5.7%-6.4% Diabetes:              >6.4% Glycemic control for   <7.0% adults with diabetes     CBG: Recent Labs  Lab 11/22/17 2016 11/23/17 0011 11/23/17 0425 11/23/17 0732 11/23/17 1137  GLUCAP 110* 111* 98 99 73    Admitting History of Present Illness.   As above    Mckensi Redinger NP-C  [facility]  [phone]

## 2017-11-24 ENCOUNTER — Inpatient Hospital Stay (HOSPITAL_COMMUNITY): Payer: Medicare Other

## 2017-11-24 DIAGNOSIS — R55 Syncope and collapse: Secondary | ICD-10-CM

## 2017-11-24 LAB — BASIC METABOLIC PANEL
Anion gap: 7 (ref 5–15)
BUN: 15 mg/dL (ref 8–23)
CALCIUM: 9.3 mg/dL (ref 8.9–10.3)
CO2: 28 mmol/L (ref 22–32)
CREATININE: 0.81 mg/dL (ref 0.44–1.00)
Chloride: 109 mmol/L (ref 98–111)
GFR calc Af Amer: >60 mL/min (ref >60)
Glucose, Bld: 105 mg/dL — ABNORMAL HIGH (ref 70–99)
Potassium: 3.8 mmol/L (ref 3.5–5.1)
SODIUM: 144 mmol/L (ref 135–145)

## 2017-11-24 LAB — CBC
HCT: 45.2 % (ref 36.0–46.0)
Hemoglobin: 14.3 g/dL (ref 12.0–15.0)
MCH: 27.7 pg (ref 26.0–34.0)
MCHC: 31.6 g/dL (ref 30.0–36.0)
MCV: 87.4 fL (ref 80.0–100.0)
NRBC: 0 % (ref 0.0–0.2)
PLATELETS: 319 10*3/uL (ref 150–400)
RBC: 5.17 MIL/uL — ABNORMAL HIGH (ref 3.87–5.11)
RDW: 12.8 % (ref 11.5–15.5)
WBC: 9.6 10*3/uL (ref 4.0–10.5)

## 2017-11-24 MED ORDER — AMLODIPINE BESYLATE 10 MG PO TABS: 10.0000 mg | ORAL_TABLET | Freq: Every day | ORAL | 6 refills | Status: AC

## 2017-11-24 NOTE — Progress Notes (Signed)
Progress Note  Patient Name: Linda Sherman Date of Encounter: 11/24/2017  Primary Cardiologist: No primary care provider on file.   Subjective   Feels well, no syncope, pre syncope, or other symptoms overnight  Inpatient Medications    Scheduled Meds: . amLODipine  10 mg Oral Daily  . aspirin EC  81 mg Oral Daily  . cholecalciferol  1,000 Units Oral Daily  . heparin  5,000 Units Subcutaneous Q8H  . levothyroxine  50 mcg Oral QAC breakfast  . metoprolol succinate  100 mg Oral Daily   Continuous Infusions:  PRN Meds: loratadine   Vital Signs    Vitals:   11/23/17 2329 11/24/17 0242 11/24/17 0730 11/24/17 1146  BP: (!) 145/65 126/68 (!) 152/61 (!) 138/52  Pulse: (!) 58 (!) 59    Resp: 18 15 13 17   Temp: 98 F (36.7 C) 97.6 F (36.4 C) 98.7 F (37.1 C) 98.8 F (37.1 C)  TempSrc: Oral Oral Oral Oral  SpO2: 96% 98% 98% 98%  Weight:      Height:       No intake or output data in the 24 hours ending 11/24/17 1645 Filed Weights   11/22/17 0112  Weight: 53.6 kg    Telemetry    Two, 4 beat runs of NSVT - Personally Reviewed  ECG    Normal sinus rhythm Left axis deviation Left bundle branch block - Personally Reviewed  Physical Exam   GEN: No acute distress.   Neck: No JVD Cardiac: RRR, no murmurs, rubs, or gallops.  Respiratory: Clear to auscultation bilaterally. GI: Soft, nontender, non-distended  MS: No edema; No deformity. Neuro:  Nonfocal  Psych: Normal affect   Labs    Chemistry Recent Labs  Lab 11/22/17 0252 11/23/17 0656 11/24/17 0357  NA 140 143 144  K 3.4* 3.7 3.8  CL 107 107 109  CO2 24 26 28   GLUCOSE 99 104* 105*  BUN 14 16 15   CREATININE 0.88 0.81 0.81  CALCIUM 9.1 9.3 9.3  PROT 6.2*  --   --   ALBUMIN 3.3*  --   --   AST 17  --   --   ALT 11  --   --   ALKPHOS 89  --   --   BILITOT 0.4  --   --   GFRNONAA >60 >60 >60  GFRAA >60 >60 >60  ANIONGAP 9 10 7      Hematology Recent Labs  Lab 11/22/17 0252  11/23/17 0656 11/24/17 0357  WBC 10.7* 8.5 9.6  RBC 4.76 5.15* 5.17*  HGB 13.6 14.6 14.3  HCT 41.2 45.1 45.2  MCV 86.6 87.6 87.4  MCH 28.6 28.3 27.7  MCHC 33.0 32.4 31.6  RDW 12.8 12.9 12.8  PLT 310 306 319    Cardiac Enzymes Recent Labs  Lab 11/22/17 1500  TROPONINI <0.03   No results for input(s): TROPIPOC in the last 168 hours.   BNPNo results for input(s): BNP, PROBNP in the last 168 hours.   DDimer No results for input(s): DDIMER in the last 168 hours.   Radiology    No results found.  Cardiac Studies   Echo 11/22/17 Study Conclusions  - Left ventricle: The cavity size was normal. Wall thickness was   normal. Systolic function was normal. The estimated ejection   fraction was in the range of 60% to 65%. Wall motion was normal;   there were no regional wall motion abnormalities. Doppler   parameters are consistent with abnormal left  ventricular   relaxation (grade 1 diastolic dysfunction). The E/e&' ratio is   between 8-15, suggesting indeterminate LV filling pressure. - Mitral valve: Mildly thickened leaflets . There was trivial   regurgitation. - Left atrium: The atrium was normal in size. - Tricuspid valve: There was trivial regurgitation. - Pulmonary arteries: PA peak pressure: 30 mm Hg (S). - Inferior vena cava: The vessel was normal in size. The   respirophasic diameter changes were in the normal range (>= 50%),   consistent with normal central venous pressure.  Impressions:  - LVEF 60-65%, normal wall thickness, normal wall motion, grade 1   DD, indeterminate LV filling pressure, trivial MR, normal LA   size, trivial TR, RVSP 30 mmHg, normal IVC.  Patient Profile     Linda Sherman is a 77 y.o. female admitted with a syncopal episode occurring while preparing food for a funeral. She feels well overnight  Assessment & Plan    Active Problems:   Syncope   Essential hypertension  The patient had no events overnight, and had no concerning  pauses on her telemetry.  She did have occasional NSVT, but she does not report any symptoms.  While her syncope does sound suspicious for vasovagal syncope (neurocardiogenic), if she has recurrent symptoms of presyncope or syncope, a implanted monitor may be a reasonable next step to capture rhythm disturbances.  No other cardiovascular recommendations at this time, we would be happy to see the patient in follow-up as an outpatient at the discretion of her primary care physician.  For questions or updates, please contact [facility] Please consult [email or web address] for contact info under        Signed, Parke Poisson, MD  11/24/2017, 4:45 PM

## 2017-11-24 NOTE — Progress Notes (Signed)
Pt. And family educated on discharge instructions. Patient discharged from facility via wheelchair.

## 2017-11-24 NOTE — Discharge Summary (Addendum)
Physician Discharge Summary         Patient ID: Linda Sherman MRN: [id number] DOB/AGE: 1940/08/02 77 y.o.  Admit date: 11/22/2017 Discharge date: 11/24/2017  Discharge Diagnoses:    Syncopal episode Hypertension Left bundle branch block History of coronary artery disease Prior CVA Hyperglycemia  Discharge summary    77 year old female who presented to the emergency room on 10/11 after syncopal episode.  She apparently was at a funeral, became hot and flushed and then subsequently passed out.  On initial arrival she was hypertensive with a pressure of 198/102, she spontaneously returned to baseline proximal he 5 minutes after the episode.  On arrival to the emergency room her EKG was noted to have a bundle branch block, troponins were negative.  CTA head showed indeterminate age lacunar infarcts.  She had just recently been prescribed clonidine. She was admitted to the intensive care, echocardiogram was obtained the EF was 60 to 65% she had grade 1 diastolic dysfunction and trivial mitral valve and tricuspid valve regurgitation.  An MRI of the brain was obtained this was negative for acute infarct but did show atrophy/chronic ischemic changes.  Electrophysiology was consulted on 10/13, they did not have clear explanation for the event other than the possibility it was related to the clonidine.  Orthostatic blood pressures were obtained and did not show significant postural changes in blood pressure.  Cardiology recommended discharging to home, they did recommend no driving for at least 6 months.  If syncopal episode were to happen again she would need a implanted loop recorder.  They recommended following up with primary care provider, we will keep clonidine off her list.  Discharge Plan by Active Problems    Syncopal episode.  Presumed possibly secondary to clonidine Plan Home on regular medications except clonidine Follow-up with PCP If this does happen again will need  implantable loop recorder  Hypertension Plan Home on Lopressor, and Norvasc.  Will discontinue clonidine  History of CVA Plan Vascular carotid ultrasounds obtained, will need to be followed up by PCP  Hypothyroidism Plan Replace/continue home medications   Significant Hospital tests/ studies   CT Head 10/11 > Age Indeterminate lacunar infarct in the left pons, unchanged lacunar infarct in the right caudate head, no evidence of hemorrhage, hydrocephalus, extra-axial collection or mass lesion/mass effect  ECHO 10/12 >> EF 60 to 65%, grade 1 diastolic dysfunction, trivial MV/TV regurg, PA P 30 mmHg MRI Brain 10/12 >> neg for acute infarct , atrophy/chronic ischemic changes   Procedures    Culture data/antimicrobials      Consults   Dr. Hurman Horn with cardiology/electrophysiology.  Consulted on 10/13: Recommended cessation of clonidine, carotid ultrasound, and follow-up with cardiology if this happens again at which time she would need loop recorder  Discharge Exam: Blood Pressure (Abnormal) 138/52 (BP Location: Right Arm)   Pulse (Abnormal) 59   Temperature 98.8 F (37.1 C) (Oral)   Respiration 17   Height 5' (1.524 m)   Weight 53.6 kg   Oxygen Saturation 98%   Body Mass Index 23.08 kg/m   General: Pleasant 77 year old white female currently resting comfortably in bed she is in no acute distress HEENT normocephalic atraumatic no jugular venous distention Point: Clear to auscultation Cardiac: Regular rate and rhythm Abdomen: Soft nontender Extremities: Warm dry brisk cap refill no edema Neuro: Awake oriented no focal deficits.  Labs at discharge   Lab Results  Component Value Date   CREATININE 0.81 11/24/2017   BUN 15 11/24/2017   NA  144 11/24/2017   K 3.8 11/24/2017   CL 109 11/24/2017   CO2 28 11/24/2017   Lab Results  Component Value Date   WBC 9.6 11/24/2017   HGB 14.3 11/24/2017   HCT 45.2 11/24/2017   MCV 87.4 11/24/2017   PLT 319 11/24/2017    Lab Results  Component Value Date   ALT 11 11/22/2017   AST 17 11/22/2017   ALKPHOS 89 11/22/2017   BILITOT 0.4 11/22/2017   No results found for: INR, PROTIME  Current radiological studies    Dg Chest Port 1 View  Result Date: 11/22/2017 CLINICAL DATA:  Syncope EXAM: PORTABLE CHEST 1 VIEW COMPARISON:  Chest x-ray dated 11/21/2017. FINDINGS: Heart size and mediastinal contours are within normal limits. Lungs are clear. No pleural effusion or pneumothorax seen. No acute or suspicious osseous finding. IMPRESSION: No active disease.  No evidence of pneumonia or pulmonary edema. Electronically Signed   By: Bary Richard M.D.   On: 11/22/2017 17:59    Disposition:    Discharge disposition: 01-Home or Self Care       Discharge Instructions    Diet - low sodium heart healthy   Complete by:  As directed    Discharge instructions   Complete by:  As directed    No driving for 6 months   Increase activity slowly   Complete by:  As directed      Allergies as of 11/24/2017    Allergen Reactions Comment   Other Other (See Comments) seldene      Medication List    Stop taking these medications   cloNIDine 0.1 MG tablet Commonly known as:  CATAPRES     Take these medications   amLODipine 10 MG tablet Commonly known as:  NORVASC Take 1 tablet (10 mg total) by mouth daily. Start taking on:  11/25/2017   aspirin EC 81 MG tablet Take 81 mg by mouth daily.   cholecalciferol 1000 units tablet Commonly known as:  VITAMIN D Take 1,000 Units by mouth daily.   levothyroxine 50 MCG tablet Commonly known as:  SYNTHROID, LEVOTHROID Take 50 mcg by mouth daily before breakfast.   loratadine 10 MG tablet Commonly known as:  CLARITIN Take 10 mg by mouth daily as needed for allergies.   metoprolol succinate 100 MG 24 hr tablet Commonly known as:  TOPROL-XL Take 100 mg by mouth daily.   VITAMIN B-12 PO Take 1 tablet by mouth daily.       Follow-up appointment     Follow-up Information    Burdine, Ananias Pilgrim, MD. Schedule an appointment as soon as possible for a visit in 1 week(s).   Specialty:  Family Medicine Contact information: [address] [place] [address] [phone]          Discharge Condition:    good  Physician Statement:   The Patient was personally examined, the discharge assessment and plan has been personally reviewed and I agree with ACNP Zarayah Lanting's assessment and plan. 32 minutes of time have been dedicated to discharge assessment, planning and discharge instructions.   Signed: Shelby Mattocks 11/24/2017, 2:10 PM

## 2017-11-24 NOTE — Progress Notes (Signed)
PT Cancellation Note  Patient Details Name: Linda Sherman MRN: [id number] DOB: 1940/06/03   Cancelled Treatment:    Reason Eval/Treat Not Completed: Patient at procedure or test/unavailable, vascular, will try back as time allows   [facility] 11/24/2017, 1:47 PM  Charlotte Crumb, PT DPT  Board Certified Neurologic Specialist Acute Rehabilitation Services Pager [phone] Office [phone]

## 2017-11-28 DIAGNOSIS — R300 Dysuria: Secondary | ICD-10-CM | POA: Diagnosis not present

## 2017-11-28 DIAGNOSIS — R55 Syncope and collapse: Secondary | ICD-10-CM | POA: Diagnosis not present

## 2017-11-28 DIAGNOSIS — Z6822 Body mass index (BMI) 22.0-22.9, adult: Secondary | ICD-10-CM | POA: Diagnosis not present

## 2017-11-28 DIAGNOSIS — I447 Left bundle-branch block, unspecified: Secondary | ICD-10-CM | POA: Diagnosis not present

## 2017-11-28 DIAGNOSIS — I10 Essential (primary) hypertension: Secondary | ICD-10-CM | POA: Diagnosis not present

## 2017-11-28 DIAGNOSIS — I5032 Chronic diastolic (congestive) heart failure: Secondary | ICD-10-CM | POA: Diagnosis not present

## 2017-12-15 ENCOUNTER — Telehealth: Payer: Self-pay | Admitting: Internal Medicine

## 2017-12-15 DIAGNOSIS — I5032 Chronic diastolic (congestive) heart failure: Secondary | ICD-10-CM | POA: Diagnosis not present

## 2017-12-15 DIAGNOSIS — R55 Syncope and collapse: Secondary | ICD-10-CM | POA: Diagnosis not present

## 2017-12-15 DIAGNOSIS — I6529 Occlusion and stenosis of unspecified carotid artery: Secondary | ICD-10-CM | POA: Diagnosis not present

## 2017-12-15 DIAGNOSIS — I10 Essential (primary) hypertension: Secondary | ICD-10-CM | POA: Diagnosis not present

## 2017-12-15 NOTE — Telephone Encounter (Signed)
Albin Felling aware will need to get clarification when Dr Graciela Husbands gets back and will call early next week with answer./cy

## 2017-12-15 NOTE — Telephone Encounter (Signed)
New message   Per Albin Felling needs to know on the discharge paperwork from [facility] on 11/24/2017. Does the patient need to stop driving for 6 weeks or 6 month? Please advise.

## 2017-12-23 NOTE — Telephone Encounter (Signed)
Per Dr Odessa FlemingKlein's consult note, pt should remain away from driving a vehicle for 6 weeks after a syncopal episode. This will be around the 1st week in December. Pt has verbalized understanding and will call the office/go to the ED if she experiences another syncopal episode.

## 2017-12-24 DIAGNOSIS — I10 Essential (primary) hypertension: Secondary | ICD-10-CM | POA: Diagnosis not present

## 2017-12-24 DIAGNOSIS — I447 Left bundle-branch block, unspecified: Secondary | ICD-10-CM | POA: Diagnosis not present

## 2017-12-24 DIAGNOSIS — E782 Mixed hyperlipidemia: Secondary | ICD-10-CM | POA: Diagnosis not present

## 2017-12-24 DIAGNOSIS — R55 Syncope and collapse: Secondary | ICD-10-CM | POA: Diagnosis not present

## 2017-12-24 DIAGNOSIS — I5032 Chronic diastolic (congestive) heart failure: Secondary | ICD-10-CM | POA: Diagnosis not present

## 2018-01-23 DIAGNOSIS — I10 Essential (primary) hypertension: Secondary | ICD-10-CM | POA: Diagnosis not present

## 2018-01-23 DIAGNOSIS — E039 Hypothyroidism, unspecified: Secondary | ICD-10-CM | POA: Diagnosis not present

## 2018-01-23 DIAGNOSIS — D519 Vitamin B12 deficiency anemia, unspecified: Secondary | ICD-10-CM | POA: Diagnosis not present

## 2018-01-23 DIAGNOSIS — E782 Mixed hyperlipidemia: Secondary | ICD-10-CM | POA: Diagnosis not present

## 2018-01-23 DIAGNOSIS — I5032 Chronic diastolic (congestive) heart failure: Secondary | ICD-10-CM | POA: Diagnosis not present

## 2018-01-27 DIAGNOSIS — D519 Vitamin B12 deficiency anemia, unspecified: Secondary | ICD-10-CM | POA: Diagnosis not present

## 2018-01-27 DIAGNOSIS — I10 Essential (primary) hypertension: Secondary | ICD-10-CM | POA: Diagnosis not present

## 2018-01-27 DIAGNOSIS — E782 Mixed hyperlipidemia: Secondary | ICD-10-CM | POA: Diagnosis not present

## 2018-01-27 DIAGNOSIS — J00 Acute nasopharyngitis [common cold]: Secondary | ICD-10-CM | POA: Diagnosis not present

## 2018-01-27 DIAGNOSIS — E559 Vitamin D deficiency, unspecified: Secondary | ICD-10-CM | POA: Diagnosis not present

## 2018-04-24 DIAGNOSIS — S20219A Contusion of unspecified front wall of thorax, initial encounter: Secondary | ICD-10-CM | POA: Diagnosis not present

## 2018-04-24 DIAGNOSIS — S63509A Unspecified sprain of unspecified wrist, initial encounter: Secondary | ICD-10-CM | POA: Diagnosis not present

## 2018-04-24 DIAGNOSIS — Z6821 Body mass index (BMI) 21.0-21.9, adult: Secondary | ICD-10-CM | POA: Diagnosis not present

## 2018-05-01 DIAGNOSIS — E782 Mixed hyperlipidemia: Secondary | ICD-10-CM | POA: Diagnosis not present

## 2018-05-01 DIAGNOSIS — E039 Hypothyroidism, unspecified: Secondary | ICD-10-CM | POA: Diagnosis not present

## 2018-05-01 DIAGNOSIS — I5032 Chronic diastolic (congestive) heart failure: Secondary | ICD-10-CM | POA: Diagnosis not present

## 2018-05-01 DIAGNOSIS — E559 Vitamin D deficiency, unspecified: Secondary | ICD-10-CM | POA: Diagnosis not present

## 2018-05-01 DIAGNOSIS — I10 Essential (primary) hypertension: Secondary | ICD-10-CM | POA: Diagnosis not present

## 2018-05-01 DIAGNOSIS — I6529 Occlusion and stenosis of unspecified carotid artery: Secondary | ICD-10-CM | POA: Diagnosis not present

## 2018-05-05 DIAGNOSIS — Z23 Encounter for immunization: Secondary | ICD-10-CM | POA: Diagnosis not present

## 2018-05-05 DIAGNOSIS — I10 Essential (primary) hypertension: Secondary | ICD-10-CM | POA: Diagnosis not present

## 2018-05-05 DIAGNOSIS — I5032 Chronic diastolic (congestive) heart failure: Secondary | ICD-10-CM | POA: Diagnosis not present

## 2018-05-05 DIAGNOSIS — Z0001 Encounter for general adult medical examination with abnormal findings: Secondary | ICD-10-CM | POA: Diagnosis not present

## 2018-05-05 DIAGNOSIS — D519 Vitamin B12 deficiency anemia, unspecified: Secondary | ICD-10-CM | POA: Diagnosis not present

## 2018-05-05 DIAGNOSIS — E559 Vitamin D deficiency, unspecified: Secondary | ICD-10-CM | POA: Diagnosis not present

## 2018-10-30 DIAGNOSIS — E039 Hypothyroidism, unspecified: Secondary | ICD-10-CM | POA: Diagnosis not present

## 2018-10-30 DIAGNOSIS — I10 Essential (primary) hypertension: Secondary | ICD-10-CM | POA: Diagnosis not present

## 2018-10-30 DIAGNOSIS — I5032 Chronic diastolic (congestive) heart failure: Secondary | ICD-10-CM | POA: Diagnosis not present

## 2018-10-30 DIAGNOSIS — E782 Mixed hyperlipidemia: Secondary | ICD-10-CM | POA: Diagnosis not present

## 2018-11-03 DIAGNOSIS — I5032 Chronic diastolic (congestive) heart failure: Secondary | ICD-10-CM | POA: Diagnosis not present

## 2018-11-03 DIAGNOSIS — Z6822 Body mass index (BMI) 22.0-22.9, adult: Secondary | ICD-10-CM | POA: Diagnosis not present

## 2018-11-03 DIAGNOSIS — E039 Hypothyroidism, unspecified: Secondary | ICD-10-CM | POA: Diagnosis not present

## 2018-11-03 DIAGNOSIS — I10 Essential (primary) hypertension: Secondary | ICD-10-CM | POA: Diagnosis not present

## 2018-11-03 DIAGNOSIS — E782 Mixed hyperlipidemia: Secondary | ICD-10-CM | POA: Diagnosis not present

## 2018-11-03 DIAGNOSIS — I739 Peripheral vascular disease, unspecified: Secondary | ICD-10-CM | POA: Diagnosis not present

## 2019-02-11 DIAGNOSIS — I10 Essential (primary) hypertension: Secondary | ICD-10-CM | POA: Diagnosis not present

## 2019-02-11 DIAGNOSIS — E782 Mixed hyperlipidemia: Secondary | ICD-10-CM | POA: Diagnosis not present

## 2019-03-12 DIAGNOSIS — E7849 Other hyperlipidemia: Secondary | ICD-10-CM | POA: Diagnosis not present

## 2019-03-12 DIAGNOSIS — I10 Essential (primary) hypertension: Secondary | ICD-10-CM | POA: Diagnosis not present

## 2019-04-09 DIAGNOSIS — E7849 Other hyperlipidemia: Secondary | ICD-10-CM | POA: Diagnosis not present

## 2019-04-09 DIAGNOSIS — I10 Essential (primary) hypertension: Secondary | ICD-10-CM | POA: Diagnosis not present

## 2019-05-05 DIAGNOSIS — E782 Mixed hyperlipidemia: Secondary | ICD-10-CM | POA: Diagnosis not present

## 2019-05-05 DIAGNOSIS — I5032 Chronic diastolic (congestive) heart failure: Secondary | ICD-10-CM | POA: Diagnosis not present

## 2019-05-05 DIAGNOSIS — I10 Essential (primary) hypertension: Secondary | ICD-10-CM | POA: Diagnosis not present

## 2019-05-05 DIAGNOSIS — E559 Vitamin D deficiency, unspecified: Secondary | ICD-10-CM | POA: Diagnosis not present

## 2019-05-05 DIAGNOSIS — E039 Hypothyroidism, unspecified: Secondary | ICD-10-CM | POA: Diagnosis not present

## 2019-05-11 ENCOUNTER — Other Ambulatory Visit (HOSPITAL_COMMUNITY): Payer: Self-pay | Admitting: Family Medicine

## 2019-05-11 ENCOUNTER — Other Ambulatory Visit: Payer: Self-pay | Admitting: Family Medicine

## 2019-05-11 DIAGNOSIS — I6529 Occlusion and stenosis of unspecified carotid artery: Secondary | ICD-10-CM | POA: Diagnosis not present

## 2019-05-11 DIAGNOSIS — F17210 Nicotine dependence, cigarettes, uncomplicated: Secondary | ICD-10-CM | POA: Diagnosis not present

## 2019-05-11 DIAGNOSIS — Z0001 Encounter for general adult medical examination with abnormal findings: Secondary | ICD-10-CM | POA: Diagnosis not present

## 2019-05-11 DIAGNOSIS — I739 Peripheral vascular disease, unspecified: Secondary | ICD-10-CM | POA: Diagnosis not present

## 2019-05-11 DIAGNOSIS — Z6822 Body mass index (BMI) 22.0-22.9, adult: Secondary | ICD-10-CM | POA: Diagnosis not present

## 2019-05-11 DIAGNOSIS — I10 Essential (primary) hypertension: Secondary | ICD-10-CM | POA: Diagnosis not present

## 2019-05-11 DIAGNOSIS — E782 Mixed hyperlipidemia: Secondary | ICD-10-CM | POA: Diagnosis not present

## 2019-05-12 DIAGNOSIS — I10 Essential (primary) hypertension: Secondary | ICD-10-CM | POA: Diagnosis not present

## 2019-05-12 DIAGNOSIS — E7849 Other hyperlipidemia: Secondary | ICD-10-CM | POA: Diagnosis not present

## 2019-05-13 ENCOUNTER — Other Ambulatory Visit: Payer: Self-pay

## 2019-05-13 ENCOUNTER — Ambulatory Visit (HOSPITAL_COMMUNITY)
Admission: RE | Admit: 2019-05-13 | Discharge: 2019-05-13 | Disposition: A | Discharge status: Home / Self Care | Payer: Medicare Other | Source: Ambulatory Visit | Attending: Family Medicine | Admitting: Family Medicine

## 2019-05-13 DIAGNOSIS — I6529 Occlusion and stenosis of unspecified carotid artery: Secondary | ICD-10-CM | POA: Diagnosis not present

## 2019-05-13 DIAGNOSIS — I6523 Occlusion and stenosis of bilateral carotid arteries: Secondary | ICD-10-CM | POA: Diagnosis not present

## 2019-06-10 ENCOUNTER — Ambulatory Visit: Payer: Medicare Other | Attending: Internal Medicine

## 2019-06-10 DIAGNOSIS — Z23 Encounter for immunization: Secondary | ICD-10-CM

## 2019-06-10 NOTE — Progress Notes (Signed)
   Covid-19 Vaccination Clinic  Name:  Linda Sherman    MRN: [id number] DOB: [birth year]  06/10/2019  Linda Sherman was observed post Covid-19 immunization for 15 minutes without incident. She was provided with Vaccine Information Sheet and instruction to access the V-Safe system.   Linda Sherman was instructed to call 911 with any severe reactions post vaccine: Marland Kitchen Difficulty breathing  . Swelling of face and throat  . A fast heartbeat  . A bad rash all over body  . Dizziness and weakness   Immunizations Administered    Name Date Dose VIS Date Route   Moderna COVID-19 Vaccine 06/10/2019  8:33 AM 0.5 mL 01/2019 Intramuscular   Manufacturer: Moderna   Lot: [id number]   NDC: 93903-009-23

## 2019-06-11 DIAGNOSIS — E7849 Other hyperlipidemia: Secondary | ICD-10-CM | POA: Diagnosis not present

## 2019-06-11 DIAGNOSIS — I10 Essential (primary) hypertension: Secondary | ICD-10-CM | POA: Diagnosis not present

## 2019-07-08 ENCOUNTER — Ambulatory Visit: Payer: Medicare Other

## 2019-07-12 DIAGNOSIS — E7849 Other hyperlipidemia: Secondary | ICD-10-CM | POA: Diagnosis not present

## 2019-07-12 DIAGNOSIS — I110 Hypertensive heart disease with heart failure: Secondary | ICD-10-CM | POA: Diagnosis not present

## 2019-07-12 DIAGNOSIS — I5032 Chronic diastolic (congestive) heart failure: Secondary | ICD-10-CM | POA: Diagnosis not present

## 2019-07-12 DIAGNOSIS — I6529 Occlusion and stenosis of unspecified carotid artery: Secondary | ICD-10-CM | POA: Diagnosis not present

## 2019-07-20 DIAGNOSIS — J309 Allergic rhinitis, unspecified: Secondary | ICD-10-CM | POA: Diagnosis not present

## 2019-07-20 DIAGNOSIS — R0982 Postnasal drip: Secondary | ICD-10-CM | POA: Diagnosis not present

## 2019-07-20 DIAGNOSIS — R05 Cough: Secondary | ICD-10-CM | POA: Diagnosis not present

## 2019-08-11 DIAGNOSIS — I6529 Occlusion and stenosis of unspecified carotid artery: Secondary | ICD-10-CM | POA: Diagnosis not present

## 2019-08-11 DIAGNOSIS — I110 Hypertensive heart disease with heart failure: Secondary | ICD-10-CM | POA: Diagnosis not present

## 2019-08-11 DIAGNOSIS — E7849 Other hyperlipidemia: Secondary | ICD-10-CM | POA: Diagnosis not present

## 2019-08-11 DIAGNOSIS — I5032 Chronic diastolic (congestive) heart failure: Secondary | ICD-10-CM | POA: Diagnosis not present

## 2019-08-18 DIAGNOSIS — J309 Allergic rhinitis, unspecified: Secondary | ICD-10-CM | POA: Diagnosis not present

## 2019-08-18 DIAGNOSIS — E039 Hypothyroidism, unspecified: Secondary | ICD-10-CM | POA: Diagnosis not present

## 2019-08-18 DIAGNOSIS — I5032 Chronic diastolic (congestive) heart failure: Secondary | ICD-10-CM | POA: Diagnosis not present

## 2019-08-18 DIAGNOSIS — E782 Mixed hyperlipidemia: Secondary | ICD-10-CM | POA: Diagnosis not present

## 2019-08-18 DIAGNOSIS — J019 Acute sinusitis, unspecified: Secondary | ICD-10-CM | POA: Diagnosis not present

## 2019-10-12 DIAGNOSIS — E7849 Other hyperlipidemia: Secondary | ICD-10-CM | POA: Diagnosis not present

## 2019-10-12 DIAGNOSIS — I5032 Chronic diastolic (congestive) heart failure: Secondary | ICD-10-CM | POA: Diagnosis not present

## 2019-10-12 DIAGNOSIS — I6529 Occlusion and stenosis of unspecified carotid artery: Secondary | ICD-10-CM | POA: Diagnosis not present

## 2019-10-12 DIAGNOSIS — I110 Hypertensive heart disease with heart failure: Secondary | ICD-10-CM | POA: Diagnosis not present

## 2019-11-08 DIAGNOSIS — E039 Hypothyroidism, unspecified: Secondary | ICD-10-CM | POA: Diagnosis not present

## 2019-11-08 DIAGNOSIS — I5032 Chronic diastolic (congestive) heart failure: Secondary | ICD-10-CM | POA: Diagnosis not present

## 2019-11-08 DIAGNOSIS — E782 Mixed hyperlipidemia: Secondary | ICD-10-CM | POA: Diagnosis not present

## 2019-11-08 DIAGNOSIS — I10 Essential (primary) hypertension: Secondary | ICD-10-CM | POA: Diagnosis not present

## 2019-11-11 ENCOUNTER — Other Ambulatory Visit: Payer: Self-pay | Admitting: Family Medicine

## 2019-11-11 ENCOUNTER — Other Ambulatory Visit (HOSPITAL_COMMUNITY): Payer: Self-pay | Admitting: Family Medicine

## 2019-11-11 DIAGNOSIS — I10 Essential (primary) hypertension: Secondary | ICD-10-CM | POA: Diagnosis not present

## 2019-11-11 DIAGNOSIS — E039 Hypothyroidism, unspecified: Secondary | ICD-10-CM | POA: Diagnosis not present

## 2019-11-11 DIAGNOSIS — I739 Peripheral vascular disease, unspecified: Secondary | ICD-10-CM

## 2019-11-11 DIAGNOSIS — E782 Mixed hyperlipidemia: Secondary | ICD-10-CM | POA: Diagnosis not present

## 2019-11-11 DIAGNOSIS — I5032 Chronic diastolic (congestive) heart failure: Secondary | ICD-10-CM | POA: Diagnosis not present

## 2019-11-11 DIAGNOSIS — Z6822 Body mass index (BMI) 22.0-22.9, adult: Secondary | ICD-10-CM | POA: Diagnosis not present

## 2019-11-16 ENCOUNTER — Other Ambulatory Visit: Payer: Self-pay

## 2019-11-16 ENCOUNTER — Ambulatory Visit (HOSPITAL_COMMUNITY)
Admission: RE | Admit: 2019-11-16 | Discharge: 2019-11-16 | Disposition: A | Discharge status: Home / Self Care | Payer: Medicare Other | Source: Ambulatory Visit | Attending: Family Medicine | Admitting: Family Medicine

## 2019-11-16 DIAGNOSIS — I739 Peripheral vascular disease, unspecified: Secondary | ICD-10-CM | POA: Diagnosis not present

## 2019-12-11 DIAGNOSIS — I110 Hypertensive heart disease with heart failure: Secondary | ICD-10-CM | POA: Diagnosis not present

## 2019-12-11 DIAGNOSIS — E7849 Other hyperlipidemia: Secondary | ICD-10-CM | POA: Diagnosis not present

## 2019-12-11 DIAGNOSIS — I5032 Chronic diastolic (congestive) heart failure: Secondary | ICD-10-CM | POA: Diagnosis not present

## 2020-01-20 DIAGNOSIS — H00011 Hordeolum externum right upper eyelid: Secondary | ICD-10-CM | POA: Diagnosis not present

## 2020-01-20 DIAGNOSIS — Z6822 Body mass index (BMI) 22.0-22.9, adult: Secondary | ICD-10-CM | POA: Diagnosis not present

## 2020-04-19 DIAGNOSIS — R059 Cough, unspecified: Secondary | ICD-10-CM | POA: Diagnosis not present

## 2020-04-19 DIAGNOSIS — J019 Acute sinusitis, unspecified: Secondary | ICD-10-CM | POA: Diagnosis not present

## 2020-05-08 DIAGNOSIS — I10 Essential (primary) hypertension: Secondary | ICD-10-CM | POA: Diagnosis not present

## 2020-05-08 DIAGNOSIS — E039 Hypothyroidism, unspecified: Secondary | ICD-10-CM | POA: Diagnosis not present

## 2020-05-08 DIAGNOSIS — E782 Mixed hyperlipidemia: Secondary | ICD-10-CM | POA: Diagnosis not present

## 2020-05-08 DIAGNOSIS — E559 Vitamin D deficiency, unspecified: Secondary | ICD-10-CM | POA: Diagnosis not present

## 2020-05-08 DIAGNOSIS — E7849 Other hyperlipidemia: Secondary | ICD-10-CM | POA: Diagnosis not present

## 2020-05-11 ENCOUNTER — Other Ambulatory Visit (HOSPITAL_COMMUNITY): Payer: Self-pay | Admitting: Family Medicine

## 2020-05-11 ENCOUNTER — Other Ambulatory Visit: Payer: Self-pay | Admitting: Family Medicine

## 2020-05-11 DIAGNOSIS — R634 Abnormal weight loss: Secondary | ICD-10-CM

## 2020-05-11 DIAGNOSIS — I739 Peripheral vascular disease, unspecified: Secondary | ICD-10-CM | POA: Diagnosis not present

## 2020-05-11 DIAGNOSIS — I5032 Chronic diastolic (congestive) heart failure: Secondary | ICD-10-CM | POA: Diagnosis not present

## 2020-05-11 DIAGNOSIS — Z0001 Encounter for general adult medical examination with abnormal findings: Secondary | ICD-10-CM | POA: Diagnosis not present

## 2020-05-11 DIAGNOSIS — E7849 Other hyperlipidemia: Secondary | ICD-10-CM | POA: Diagnosis not present

## 2020-05-11 DIAGNOSIS — E559 Vitamin D deficiency, unspecified: Secondary | ICD-10-CM | POA: Diagnosis not present

## 2020-05-11 DIAGNOSIS — I10 Essential (primary) hypertension: Secondary | ICD-10-CM | POA: Diagnosis not present

## 2020-05-11 DIAGNOSIS — F17210 Nicotine dependence, cigarettes, uncomplicated: Secondary | ICD-10-CM

## 2020-05-11 DIAGNOSIS — E039 Hypothyroidism, unspecified: Secondary | ICD-10-CM | POA: Diagnosis not present

## 2020-05-25 ENCOUNTER — Encounter: Payer: Self-pay | Admitting: Gastroenterology

## 2020-06-08 ENCOUNTER — Ambulatory Visit (HOSPITAL_COMMUNITY)
Admission: RE | Admit: 2020-06-08 | Discharge: 2020-06-08 | Disposition: A | Discharge status: Home / Self Care | Payer: Medicare Other | Source: Ambulatory Visit | Attending: Family Medicine | Admitting: Family Medicine

## 2020-06-08 DIAGNOSIS — R634 Abnormal weight loss: Secondary | ICD-10-CM

## 2020-06-08 DIAGNOSIS — J984 Other disorders of lung: Secondary | ICD-10-CM | POA: Diagnosis not present

## 2020-06-08 DIAGNOSIS — F17210 Nicotine dependence, cigarettes, uncomplicated: Secondary | ICD-10-CM | POA: Diagnosis not present

## 2020-06-08 DIAGNOSIS — J432 Centrilobular emphysema: Secondary | ICD-10-CM | POA: Diagnosis not present

## 2020-06-08 DIAGNOSIS — J8410 Pulmonary fibrosis, unspecified: Secondary | ICD-10-CM | POA: Diagnosis not present

## 2020-06-10 DIAGNOSIS — I6529 Occlusion and stenosis of unspecified carotid artery: Secondary | ICD-10-CM | POA: Diagnosis not present

## 2020-06-10 DIAGNOSIS — I110 Hypertensive heart disease with heart failure: Secondary | ICD-10-CM | POA: Diagnosis not present

## 2020-06-10 DIAGNOSIS — I5032 Chronic diastolic (congestive) heart failure: Secondary | ICD-10-CM | POA: Diagnosis not present

## 2020-06-10 DIAGNOSIS — E7849 Other hyperlipidemia: Secondary | ICD-10-CM | POA: Diagnosis not present

## 2020-07-10 DIAGNOSIS — I5032 Chronic diastolic (congestive) heart failure: Secondary | ICD-10-CM | POA: Diagnosis not present

## 2020-07-10 DIAGNOSIS — I6529 Occlusion and stenosis of unspecified carotid artery: Secondary | ICD-10-CM | POA: Diagnosis not present

## 2020-07-10 DIAGNOSIS — E7849 Other hyperlipidemia: Secondary | ICD-10-CM | POA: Diagnosis not present

## 2020-07-10 DIAGNOSIS — I110 Hypertensive heart disease with heart failure: Secondary | ICD-10-CM | POA: Diagnosis not present

## 2020-08-08 DIAGNOSIS — D519 Vitamin B12 deficiency anemia, unspecified: Secondary | ICD-10-CM | POA: Diagnosis not present

## 2020-08-08 DIAGNOSIS — I10 Essential (primary) hypertension: Secondary | ICD-10-CM | POA: Diagnosis not present

## 2020-08-08 DIAGNOSIS — E039 Hypothyroidism, unspecified: Secondary | ICD-10-CM | POA: Diagnosis not present

## 2020-08-08 DIAGNOSIS — I5032 Chronic diastolic (congestive) heart failure: Secondary | ICD-10-CM | POA: Diagnosis not present

## 2020-08-08 DIAGNOSIS — E782 Mixed hyperlipidemia: Secondary | ICD-10-CM | POA: Diagnosis not present

## 2020-08-08 DIAGNOSIS — E7849 Other hyperlipidemia: Secondary | ICD-10-CM | POA: Diagnosis not present

## 2020-08-10 DIAGNOSIS — F17210 Nicotine dependence, cigarettes, uncomplicated: Secondary | ICD-10-CM | POA: Diagnosis not present

## 2020-08-10 DIAGNOSIS — I110 Hypertensive heart disease with heart failure: Secondary | ICD-10-CM | POA: Diagnosis not present

## 2020-08-10 DIAGNOSIS — I700 Atherosclerosis of aorta: Secondary | ICD-10-CM | POA: Diagnosis not present

## 2020-08-10 DIAGNOSIS — J439 Emphysema, unspecified: Secondary | ICD-10-CM | POA: Diagnosis not present

## 2020-08-10 DIAGNOSIS — R911 Solitary pulmonary nodule: Secondary | ICD-10-CM | POA: Diagnosis not present

## 2020-08-10 DIAGNOSIS — I5032 Chronic diastolic (congestive) heart failure: Secondary | ICD-10-CM | POA: Diagnosis not present

## 2020-08-10 DIAGNOSIS — E7849 Other hyperlipidemia: Secondary | ICD-10-CM | POA: Diagnosis not present

## 2020-08-10 DIAGNOSIS — I6529 Occlusion and stenosis of unspecified carotid artery: Secondary | ICD-10-CM | POA: Diagnosis not present

## 2020-08-10 DIAGNOSIS — R634 Abnormal weight loss: Secondary | ICD-10-CM | POA: Diagnosis not present

## 2020-08-10 DIAGNOSIS — I10 Essential (primary) hypertension: Secondary | ICD-10-CM | POA: Diagnosis not present

## 2020-08-16 ENCOUNTER — Ambulatory Visit: Payer: Medicare Other | Admitting: Gastroenterology

## 2020-09-10 DIAGNOSIS — E7849 Other hyperlipidemia: Secondary | ICD-10-CM | POA: Diagnosis not present

## 2020-09-10 DIAGNOSIS — I110 Hypertensive heart disease with heart failure: Secondary | ICD-10-CM | POA: Diagnosis not present

## 2020-09-10 DIAGNOSIS — I6529 Occlusion and stenosis of unspecified carotid artery: Secondary | ICD-10-CM | POA: Diagnosis not present

## 2020-09-10 DIAGNOSIS — I5032 Chronic diastolic (congestive) heart failure: Secondary | ICD-10-CM | POA: Diagnosis not present

## 2020-10-06 DIAGNOSIS — E782 Mixed hyperlipidemia: Secondary | ICD-10-CM | POA: Diagnosis not present

## 2020-10-06 DIAGNOSIS — E039 Hypothyroidism, unspecified: Secondary | ICD-10-CM | POA: Diagnosis not present

## 2020-10-06 DIAGNOSIS — I10 Essential (primary) hypertension: Secondary | ICD-10-CM | POA: Diagnosis not present

## 2020-10-06 DIAGNOSIS — E7849 Other hyperlipidemia: Secondary | ICD-10-CM | POA: Diagnosis not present

## 2020-10-09 ENCOUNTER — Other Ambulatory Visit (HOSPITAL_COMMUNITY): Payer: Self-pay | Admitting: Family Medicine

## 2020-10-09 DIAGNOSIS — E7849 Other hyperlipidemia: Secondary | ICD-10-CM | POA: Diagnosis not present

## 2020-10-09 DIAGNOSIS — R634 Abnormal weight loss: Secondary | ICD-10-CM | POA: Diagnosis not present

## 2020-10-09 DIAGNOSIS — J439 Emphysema, unspecified: Secondary | ICD-10-CM | POA: Diagnosis not present

## 2020-10-09 DIAGNOSIS — Z13820 Encounter for screening for osteoporosis: Secondary | ICD-10-CM

## 2020-10-09 DIAGNOSIS — E039 Hypothyroidism, unspecified: Secondary | ICD-10-CM | POA: Diagnosis not present

## 2020-10-09 DIAGNOSIS — R911 Solitary pulmonary nodule: Secondary | ICD-10-CM | POA: Diagnosis not present

## 2020-10-09 DIAGNOSIS — I700 Atherosclerosis of aorta: Secondary | ICD-10-CM | POA: Diagnosis not present

## 2020-10-09 DIAGNOSIS — I10 Essential (primary) hypertension: Secondary | ICD-10-CM | POA: Diagnosis not present

## 2020-10-09 DIAGNOSIS — I5032 Chronic diastolic (congestive) heart failure: Secondary | ICD-10-CM | POA: Diagnosis not present

## 2020-10-11 DIAGNOSIS — E7849 Other hyperlipidemia: Secondary | ICD-10-CM | POA: Diagnosis not present

## 2020-10-11 DIAGNOSIS — I5032 Chronic diastolic (congestive) heart failure: Secondary | ICD-10-CM | POA: Diagnosis not present

## 2020-10-11 DIAGNOSIS — I110 Hypertensive heart disease with heart failure: Secondary | ICD-10-CM | POA: Diagnosis not present

## 2020-10-11 DIAGNOSIS — I6529 Occlusion and stenosis of unspecified carotid artery: Secondary | ICD-10-CM | POA: Diagnosis not present

## 2020-10-18 ENCOUNTER — Ambulatory Visit (HOSPITAL_COMMUNITY)
Admission: RE | Admit: 2020-10-18 | Discharge: 2020-10-18 | Disposition: A | Discharge status: Home / Self Care | Payer: Medicare Other | Source: Ambulatory Visit | Attending: Family Medicine | Admitting: Family Medicine

## 2020-10-18 ENCOUNTER — Other Ambulatory Visit: Payer: Self-pay

## 2020-10-18 DIAGNOSIS — M8588 Other specified disorders of bone density and structure, other site: Secondary | ICD-10-CM | POA: Diagnosis not present

## 2020-10-18 DIAGNOSIS — Z780 Asymptomatic menopausal state: Secondary | ICD-10-CM | POA: Diagnosis not present

## 2020-10-18 DIAGNOSIS — Z13820 Encounter for screening for osteoporosis: Secondary | ICD-10-CM | POA: Diagnosis not present

## 2020-10-18 DIAGNOSIS — M810 Age-related osteoporosis without current pathological fracture: Secondary | ICD-10-CM | POA: Insufficient documentation

## 2020-11-08 ENCOUNTER — Encounter: Payer: Self-pay | Admitting: Gastroenterology

## 2020-12-30 ENCOUNTER — Encounter: Payer: Self-pay | Admitting: Gastroenterology

## 2020-12-30 NOTE — Progress Notes (Deleted)
Referring Provider:Burdine, Ananias Pilgrim, MD Primary Care Physician:  Juliette Alcide, MD Primary Gastroenterologist:  Dr. Bonnetta Barry chief complaint on file.   HPI:   Linda Sherman is a 80 y.o. female presenting today at the request of Burdine, Ananias Pilgrim, MD for consult colonoscopy.  Office visit due to age.  Past Medical History:  Diagnosis Date   Anxiety state, unspecified    Arthritis    Chest pain, unspecified    Chronic airway obstruction, not elsewhere classified    Coronary artery disease 2008   cardiac catheterization   Dyslipidemia    Hypothyroidism    Intermediate coronary syndrome (HCC)    Left ventricular dysfunction with preserved left ventricular function    Other malaise and fatigue    Palpitations    Peripheral vascular disease, unspecified (HCC)    Shortness of breath    Tobacco use disorder    Unspecified essential hypertension     Past Surgical History:  Procedure Laterality Date   ABDOMINAL HYSTERECTOMY     APPENDECTOMY     CATARACT EXTRACTION W/PHACO Right 03/10/2017   Procedure: CATARACT EXTRACTION PHACO AND INTRAOCULAR LENS PLACEMENT RIGHT EYE;  Surgeon: Gemma Payor, MD;  Location: [facility];  Service: Ophthalmology;  Laterality: Right;  CDE: 10.22   CATARACT EXTRACTION W/PHACO Left 03/24/2017   Procedure: CATARACT EXTRACTION PHACO AND INTRAOCULAR LENS PLACEMENT LEFT EYE;  Surgeon: Gemma Payor, MD;  Location: [facility];  Service: Ophthalmology;  Laterality: Left;  CDE: 9.18    Current Outpatient Medications  Medication Sig Dispense Refill   amLODipine (NORVASC) 10 MG tablet Take 1 tablet (10 mg total) by mouth daily. 30 tablet 6   aspirin EC 81 MG tablet Take 81 mg by mouth daily.     cholecalciferol (VITAMIN D) 1000 units tablet Take 1,000 Units by mouth daily.     Cyanocobalamin (VITAMIN B-12 PO) Take 1 tablet by mouth daily.     levothyroxine (SYNTHROID, LEVOTHROID) 50 MCG tablet Take 50 mcg by mouth daily before breakfast.     loratadine (CLARITIN)  10 MG tablet Take 10 mg by mouth daily as needed for allergies.      metoprolol succinate (TOPROL-XL) 100 MG 24 hr tablet Take 100 mg by mouth daily.      No current facility-administered medications for this visit.    Allergies as of 01/01/2021 - Review Complete 11/23/2017  Allergen Reaction Noted   Other Other (See Comments) 03/24/2017    Family History  Problem Relation Age of Onset   Heart attack Sister 29       MI    Social History   Socioeconomic History   Marital status: Married    Spouse name: WAYNE   Number of children: Not on file   Years of education: Not on file   Highest education level: Not on file  Occupational History   Occupation: RETIRED    Comment: [employer]  Tobacco Use   Smoking status: Every Day    Packs/day: 0.50    Years: 47.00    Pack years: 23.50    Types: Cigarettes   Smokeless tobacco: Never   Tobacco comments:    Started smoking at age 58  Vaping Use   Vaping Use: Never used  Substance and Sexual Activity   Alcohol use: No   Drug use: No   Sexual activity: Yes    Birth control/protection: Surgical  Other Topics Concern   Not on file  Social History Narrative   patient  lives in [place] she is the mother Efrain Sella, who used to work in our office   Social Determinants of Health   Financial Resource Strain: Not on file  Food Insecurity: Not on file  Transportation Needs: Not on file  Physical Activity: Not on file  Stress: Not on file  Social Connections: Not on file  Intimate Partner Violence: Not on file    Review of Systems: Gen: Denies any fever, chills, fatigue, weight loss, lack of appetite.  CV: Denies chest pain, heart palpitations, peripheral edema, syncope.  Resp: Denies shortness of breath at rest or with exertion. Denies wheezing or cough.  GI: Denies dysphagia or odynophagia. Denies jaundice, hematemesis, fecal incontinence. GU : Denies urinary burning, urinary frequency, urinary hesitancy MS: Denies joint pain,  muscle weakness, cramps, or limitation of movement.  Derm: Denies rash, itching, dry skin Psych: Denies depression, anxiety, memory loss, and confusion Heme: Denies bruising, bleeding, and enlarged lymph nodes.  Physical Exam: There were no vitals taken for this visit. General:   Alert and oriented. Pleasant and cooperative. Well-nourished and well-developed.  Head:  Normocephalic and atraumatic. Eyes:  Without icterus, sclera clear and conjunctiva pink.  Ears:  Normal auditory acuity. Nose:  No deformity, discharge,  or lesions. Mouth:  No deformity or lesions, oral mucosa pink.  Neck:  Supple, without mass or thyromegaly. Lungs:  Clear to auscultation bilaterally. No wheezes, rales, or rhonchi. No distress.  Heart:  S1, S2 present without murmurs appreciated.  Abdomen:  +BS, soft, non-tender and non-distended. No HSM noted. No guarding or rebound. No masses appreciated.  Rectal:  Deferred  Msk:  Symmetrical without gross deformities. Normal posture. Pulses:  Normal pulses noted. Extremities:  Without clubbing or edema. Neurologic:  Alert and  oriented x4;  grossly normal neurologically. Skin:  Intact without significant lesions or rashes. Cervical Nodes:  No significant cervical adenopathy. Psych:  Alert and cooperative. Normal mood and affect.

## 2021-01-01 ENCOUNTER — Ambulatory Visit: Payer: Medicare Other | Admitting: Gastroenterology

## 2021-01-03 DIAGNOSIS — E039 Hypothyroidism, unspecified: Secondary | ICD-10-CM | POA: Diagnosis not present

## 2021-01-03 DIAGNOSIS — D519 Vitamin B12 deficiency anemia, unspecified: Secondary | ICD-10-CM | POA: Diagnosis not present

## 2021-01-03 DIAGNOSIS — E7849 Other hyperlipidemia: Secondary | ICD-10-CM | POA: Diagnosis not present

## 2021-01-03 DIAGNOSIS — I10 Essential (primary) hypertension: Secondary | ICD-10-CM | POA: Diagnosis not present

## 2021-01-03 DIAGNOSIS — E782 Mixed hyperlipidemia: Secondary | ICD-10-CM | POA: Diagnosis not present

## 2021-01-03 DIAGNOSIS — E559 Vitamin D deficiency, unspecified: Secondary | ICD-10-CM | POA: Diagnosis not present

## 2021-01-15 DIAGNOSIS — J439 Emphysema, unspecified: Secondary | ICD-10-CM | POA: Diagnosis not present

## 2021-01-15 DIAGNOSIS — I5032 Chronic diastolic (congestive) heart failure: Secondary | ICD-10-CM | POA: Diagnosis not present

## 2021-01-15 DIAGNOSIS — I700 Atherosclerosis of aorta: Secondary | ICD-10-CM | POA: Diagnosis not present

## 2021-01-15 DIAGNOSIS — R911 Solitary pulmonary nodule: Secondary | ICD-10-CM | POA: Diagnosis not present

## 2021-01-15 DIAGNOSIS — F17210 Nicotine dependence, cigarettes, uncomplicated: Secondary | ICD-10-CM | POA: Diagnosis not present

## 2021-01-15 DIAGNOSIS — I10 Essential (primary) hypertension: Secondary | ICD-10-CM | POA: Diagnosis not present

## 2021-01-15 DIAGNOSIS — E039 Hypothyroidism, unspecified: Secondary | ICD-10-CM | POA: Diagnosis not present

## 2021-01-15 DIAGNOSIS — E7849 Other hyperlipidemia: Secondary | ICD-10-CM | POA: Diagnosis not present

## 2021-07-05 NOTE — Addendum Note (Signed)
Encounter addended by: Novella Olive on: 07/05/2021 1:38 PM  Actions taken: Letter saved

## 2021-07-16 DIAGNOSIS — I10 Essential (primary) hypertension: Secondary | ICD-10-CM | POA: Diagnosis not present

## 2021-07-16 DIAGNOSIS — E039 Hypothyroidism, unspecified: Secondary | ICD-10-CM | POA: Diagnosis not present

## 2021-07-16 DIAGNOSIS — E7849 Other hyperlipidemia: Secondary | ICD-10-CM | POA: Diagnosis not present

## 2021-07-16 DIAGNOSIS — E559 Vitamin D deficiency, unspecified: Secondary | ICD-10-CM | POA: Diagnosis not present

## 2021-07-16 DIAGNOSIS — E782 Mixed hyperlipidemia: Secondary | ICD-10-CM | POA: Diagnosis not present

## 2021-07-20 DIAGNOSIS — Z23 Encounter for immunization: Secondary | ICD-10-CM | POA: Diagnosis not present

## 2021-07-20 DIAGNOSIS — F17210 Nicotine dependence, cigarettes, uncomplicated: Secondary | ICD-10-CM | POA: Diagnosis not present

## 2021-07-20 DIAGNOSIS — E7849 Other hyperlipidemia: Secondary | ICD-10-CM | POA: Diagnosis not present

## 2021-07-20 DIAGNOSIS — I700 Atherosclerosis of aorta: Secondary | ICD-10-CM | POA: Diagnosis not present

## 2021-07-20 DIAGNOSIS — I739 Peripheral vascular disease, unspecified: Secondary | ICD-10-CM | POA: Diagnosis not present

## 2021-07-20 DIAGNOSIS — E039 Hypothyroidism, unspecified: Secondary | ICD-10-CM | POA: Diagnosis not present

## 2021-07-20 DIAGNOSIS — Z0001 Encounter for general adult medical examination with abnormal findings: Secondary | ICD-10-CM | POA: Diagnosis not present

## 2021-07-20 DIAGNOSIS — I10 Essential (primary) hypertension: Secondary | ICD-10-CM | POA: Diagnosis not present

## 2021-07-20 DIAGNOSIS — I5032 Chronic diastolic (congestive) heart failure: Secondary | ICD-10-CM | POA: Diagnosis not present

## 2021-07-20 DIAGNOSIS — R911 Solitary pulmonary nodule: Secondary | ICD-10-CM | POA: Diagnosis not present

## 2021-07-20 DIAGNOSIS — J439 Emphysema, unspecified: Secondary | ICD-10-CM | POA: Diagnosis not present

## 2021-07-26 DIAGNOSIS — I2510 Atherosclerotic heart disease of native coronary artery without angina pectoris: Secondary | ICD-10-CM | POA: Diagnosis not present

## 2021-07-26 DIAGNOSIS — R911 Solitary pulmonary nodule: Secondary | ICD-10-CM | POA: Diagnosis not present

## 2021-07-26 DIAGNOSIS — I700 Atherosclerosis of aorta: Secondary | ICD-10-CM | POA: Diagnosis not present

## 2021-07-26 DIAGNOSIS — F17219 Nicotine dependence, cigarettes, with unspecified nicotine-induced disorders: Secondary | ICD-10-CM | POA: Diagnosis not present

## 2021-07-26 DIAGNOSIS — J439 Emphysema, unspecified: Secondary | ICD-10-CM | POA: Diagnosis not present

## 2021-10-09 DIAGNOSIS — H6092 Unspecified otitis externa, left ear: Secondary | ICD-10-CM | POA: Diagnosis not present

## 2021-10-09 DIAGNOSIS — Z6821 Body mass index (BMI) 21.0-21.9, adult: Secondary | ICD-10-CM | POA: Diagnosis not present

## 2022-01-02 DIAGNOSIS — J329 Chronic sinusitis, unspecified: Secondary | ICD-10-CM | POA: Diagnosis not present

## 2022-01-02 DIAGNOSIS — J019 Acute sinusitis, unspecified: Secondary | ICD-10-CM | POA: Diagnosis not present

## 2022-01-02 DIAGNOSIS — F17210 Nicotine dependence, cigarettes, uncomplicated: Secondary | ICD-10-CM | POA: Diagnosis not present

## 2022-01-02 DIAGNOSIS — Z6821 Body mass index (BMI) 21.0-21.9, adult: Secondary | ICD-10-CM | POA: Diagnosis not present

## 2022-01-02 DIAGNOSIS — R030 Elevated blood-pressure reading, without diagnosis of hypertension: Secondary | ICD-10-CM | POA: Diagnosis not present

## 2022-01-14 DIAGNOSIS — I5032 Chronic diastolic (congestive) heart failure: Secondary | ICD-10-CM | POA: Diagnosis not present

## 2022-01-14 DIAGNOSIS — I10 Essential (primary) hypertension: Secondary | ICD-10-CM | POA: Diagnosis not present

## 2022-01-14 DIAGNOSIS — E7849 Other hyperlipidemia: Secondary | ICD-10-CM | POA: Diagnosis not present

## 2022-01-14 DIAGNOSIS — E039 Hypothyroidism, unspecified: Secondary | ICD-10-CM | POA: Diagnosis not present

## 2022-01-17 DIAGNOSIS — F17210 Nicotine dependence, cigarettes, uncomplicated: Secondary | ICD-10-CM | POA: Diagnosis not present

## 2022-01-17 DIAGNOSIS — R030 Elevated blood-pressure reading, without diagnosis of hypertension: Secondary | ICD-10-CM | POA: Diagnosis not present

## 2022-01-17 DIAGNOSIS — K2289 Other specified disease of esophagus: Secondary | ICD-10-CM | POA: Diagnosis not present

## 2022-01-17 DIAGNOSIS — J439 Emphysema, unspecified: Secondary | ICD-10-CM | POA: Diagnosis not present

## 2022-01-17 DIAGNOSIS — Z6820 Body mass index (BMI) 20.0-20.9, adult: Secondary | ICD-10-CM | POA: Diagnosis not present

## 2022-01-17 DIAGNOSIS — E039 Hypothyroidism, unspecified: Secondary | ICD-10-CM | POA: Diagnosis not present

## 2022-01-17 DIAGNOSIS — E7849 Other hyperlipidemia: Secondary | ICD-10-CM | POA: Diagnosis not present

## 2022-01-17 DIAGNOSIS — I5032 Chronic diastolic (congestive) heart failure: Secondary | ICD-10-CM | POA: Diagnosis not present

## 2022-01-17 DIAGNOSIS — Z23 Encounter for immunization: Secondary | ICD-10-CM | POA: Diagnosis not present

## 2022-01-17 DIAGNOSIS — I700 Atherosclerosis of aorta: Secondary | ICD-10-CM | POA: Diagnosis not present

## 2022-01-17 DIAGNOSIS — R911 Solitary pulmonary nodule: Secondary | ICD-10-CM | POA: Diagnosis not present

## 2022-01-17 DIAGNOSIS — I739 Peripheral vascular disease, unspecified: Secondary | ICD-10-CM | POA: Diagnosis not present

## 2022-01-25 DIAGNOSIS — K449 Diaphragmatic hernia without obstruction or gangrene: Secondary | ICD-10-CM | POA: Diagnosis not present

## 2022-01-25 DIAGNOSIS — K224 Dyskinesia of esophagus: Secondary | ICD-10-CM | POA: Diagnosis not present

## 2022-01-25 DIAGNOSIS — K2289 Other specified disease of esophagus: Secondary | ICD-10-CM | POA: Diagnosis not present

## 2022-07-23 DIAGNOSIS — I5032 Chronic diastolic (congestive) heart failure: Secondary | ICD-10-CM | POA: Diagnosis not present

## 2022-07-23 DIAGNOSIS — E7849 Other hyperlipidemia: Secondary | ICD-10-CM | POA: Diagnosis not present

## 2022-07-23 DIAGNOSIS — E039 Hypothyroidism, unspecified: Secondary | ICD-10-CM | POA: Diagnosis not present

## 2022-07-23 DIAGNOSIS — E559 Vitamin D deficiency, unspecified: Secondary | ICD-10-CM | POA: Diagnosis not present

## 2022-07-23 DIAGNOSIS — I10 Essential (primary) hypertension: Secondary | ICD-10-CM | POA: Diagnosis not present

## 2022-07-30 DIAGNOSIS — F4321 Adjustment disorder with depressed mood: Secondary | ICD-10-CM | POA: Diagnosis not present

## 2022-07-30 DIAGNOSIS — Z72820 Sleep deprivation: Secondary | ICD-10-CM | POA: Diagnosis not present

## 2022-07-30 DIAGNOSIS — F430 Acute stress reaction: Secondary | ICD-10-CM | POA: Diagnosis not present

## 2022-07-30 DIAGNOSIS — D692 Other nonthrombocytopenic purpura: Secondary | ICD-10-CM | POA: Diagnosis not present

## 2022-08-01 ENCOUNTER — Other Ambulatory Visit (HOSPITAL_COMMUNITY): Payer: Self-pay | Admitting: Family Medicine

## 2022-08-01 DIAGNOSIS — I700 Atherosclerosis of aorta: Secondary | ICD-10-CM

## 2022-08-01 DIAGNOSIS — K2289 Other specified disease of esophagus: Secondary | ICD-10-CM

## 2022-08-01 DIAGNOSIS — R911 Solitary pulmonary nodule: Secondary | ICD-10-CM

## 2022-08-01 DIAGNOSIS — J439 Emphysema, unspecified: Secondary | ICD-10-CM

## 2022-08-01 DIAGNOSIS — F17210 Nicotine dependence, cigarettes, uncomplicated: Secondary | ICD-10-CM

## 2022-09-18 ENCOUNTER — Ambulatory Visit (HOSPITAL_COMMUNITY): Payer: Medicare Other

## 2022-09-30 DIAGNOSIS — Z72820 Sleep deprivation: Secondary | ICD-10-CM | POA: Diagnosis not present

## 2022-09-30 DIAGNOSIS — F430 Acute stress reaction: Secondary | ICD-10-CM | POA: Diagnosis not present

## 2022-09-30 DIAGNOSIS — F4321 Adjustment disorder with depressed mood: Secondary | ICD-10-CM | POA: Diagnosis not present

## 2022-09-30 DIAGNOSIS — D692 Other nonthrombocytopenic purpura: Secondary | ICD-10-CM | POA: Diagnosis not present

## 2022-09-30 DIAGNOSIS — Z6820 Body mass index (BMI) 20.0-20.9, adult: Secondary | ICD-10-CM | POA: Diagnosis not present

## 2022-10-17 ENCOUNTER — Ambulatory Visit (HOSPITAL_COMMUNITY)
Admission: RE | Admit: 2022-10-17 | Discharge: 2022-10-17 | Disposition: A | Discharge status: Home / Self Care | Payer: Medicare Other | Source: Ambulatory Visit | Attending: Family Medicine | Admitting: Family Medicine

## 2022-10-17 DIAGNOSIS — K2289 Other specified disease of esophagus: Secondary | ICD-10-CM | POA: Diagnosis not present

## 2022-10-17 DIAGNOSIS — I700 Atherosclerosis of aorta: Secondary | ICD-10-CM | POA: Diagnosis not present

## 2022-10-17 DIAGNOSIS — F17210 Nicotine dependence, cigarettes, uncomplicated: Secondary | ICD-10-CM | POA: Insufficient documentation

## 2022-10-17 DIAGNOSIS — R911 Solitary pulmonary nodule: Secondary | ICD-10-CM | POA: Diagnosis not present

## 2022-10-17 DIAGNOSIS — J439 Emphysema, unspecified: Secondary | ICD-10-CM | POA: Insufficient documentation

## 2022-10-17 DIAGNOSIS — J432 Centrilobular emphysema: Secondary | ICD-10-CM | POA: Diagnosis not present

## 2022-11-22 ENCOUNTER — Emergency Department (HOSPITAL_COMMUNITY)
Admission: EM | Admit: 2022-11-22 | Discharge: 2022-11-22 | Disposition: A | Discharge status: Home / Self Care | Payer: Medicare Other | Attending: Emergency Medicine | Admitting: Emergency Medicine

## 2022-11-22 ENCOUNTER — Other Ambulatory Visit: Payer: Self-pay

## 2022-11-22 ENCOUNTER — Encounter (HOSPITAL_COMMUNITY): Payer: Self-pay

## 2022-11-22 ENCOUNTER — Emergency Department (HOSPITAL_COMMUNITY): Payer: Medicare Other

## 2022-11-22 DIAGNOSIS — Z7982 Long term (current) use of aspirin: Secondary | ICD-10-CM | POA: Insufficient documentation

## 2022-11-22 DIAGNOSIS — R202 Paresthesia of skin: Secondary | ICD-10-CM | POA: Insufficient documentation

## 2022-11-22 DIAGNOSIS — N3000 Acute cystitis without hematuria: Secondary | ICD-10-CM | POA: Diagnosis not present

## 2022-11-22 DIAGNOSIS — M8580 Other specified disorders of bone density and structure, unspecified site: Secondary | ICD-10-CM | POA: Diagnosis not present

## 2022-11-22 DIAGNOSIS — M545 Low back pain, unspecified: Secondary | ICD-10-CM | POA: Diagnosis not present

## 2022-11-22 DIAGNOSIS — M47816 Spondylosis without myelopathy or radiculopathy, lumbar region: Secondary | ICD-10-CM | POA: Diagnosis not present

## 2022-11-22 DIAGNOSIS — M5459 Other low back pain: Secondary | ICD-10-CM | POA: Diagnosis not present

## 2022-11-22 LAB — URINALYSIS, ROUTINE W REFLEX MICROSCOPIC
Bilirubin Urine: NEGATIVE
Glucose, UA: NEGATIVE mg/dL
Ketones, ur: NEGATIVE mg/dL
Nitrite: POSITIVE — AB
Protein, ur: NEGATIVE mg/dL
Specific Gravity, Urine: 1.016 (ref 1.005–1.030)
pH: 6 (ref 5.0–8.0)

## 2022-11-22 MED ORDER — ONDANSETRON 4 MG PO TBDP
4.0000 mg | ORAL_TABLET | Freq: Once | ORAL | Status: AC
  Administered 2022-11-22: 4 mg via ORAL
  Filled 2022-11-22: qty 1

## 2022-11-22 MED ORDER — HYDROCODONE-ACETAMINOPHEN 5-325 MG PO TABS
1.0000 | ORAL_TABLET | Freq: Once | ORAL | Status: AC
  Administered 2022-11-22: 1 via ORAL
  Filled 2022-11-22: qty 1

## 2022-11-22 MED ORDER — LIDOCAINE 5 % EX PTCH
1.0000 | MEDICATED_PATCH | CUTANEOUS | Status: DC
  Administered 2022-11-22: 1 via TRANSDERMAL
  Filled 2022-11-22 (×2): qty 1

## 2022-11-22 MED ORDER — LIDOCAINE 5 % EX PTCH: 1.0000 | MEDICATED_PATCH | CUTANEOUS | 0 refills | Status: AC

## 2022-11-22 MED ORDER — CEPHALEXIN 500 MG PO CAPS
500.0000 mg | ORAL_CAPSULE | Freq: Once | ORAL | Status: AC
  Administered 2022-11-22: 500 mg via ORAL
  Filled 2022-11-22: qty 1

## 2022-11-22 MED ORDER — HYDROCODONE-ACETAMINOPHEN 5-325 MG PO TABS
0.5000 | ORAL_TABLET | Freq: Four times a day (QID) | ORAL | 0 refills | Status: AC | PRN
Start: 2022-11-22 — End: ?

## 2022-11-22 MED ORDER — CEPHALEXIN 500 MG PO CAPS
500.0000 mg | ORAL_CAPSULE | Freq: Two times a day (BID) | ORAL | 0 refills | Status: AC
Start: 2022-11-22 — End: 2022-11-29

## 2022-11-22 NOTE — Discharge Instructions (Signed)
Take the entire course of the antibiotics prescribed for urinary tract infection.  You have also been prescribed a lidocaine patch to apply to your lower back.  Use the hydrocodone tablets very sparingly as this medication can make you sleepy, but should help with pain relief.  Remember that each tablet contains 325 mg of acetaminophen (Tylenol), the maximum safe dose of Tylenol for 24-hour period is 4000 mg.  Apply gentle heat to your lower back for 20 minutes 3-4 times daily.  Use your walker as needed for stability in your home.  Keep your feet propped up when sitting and lying to create a small flexion in your hips which can take the pressure off your lower back and help heal this exacerbation of your back pain.

## 2022-11-22 NOTE — ED Provider Notes (Signed)
[place] EMERGENCY DEPARTMENT AT [facility] Provider Note   CSN: [id number] Arrival date & time: 11/22/22  1049     History  Chief Complaint  Patient presents with   Back Pain    Linda Sherman is a 82 y.o. female with occasional episodes of low back pain presenting with a 1 week history of gradually worsening pain in the lower bilateral back, right greater than left, denying midline pain.  She also denies injuries or falls.  Her pain is constant, better at rest, severe with movement.  She describes having to pull herself up and start with a very wide stance and gradually wiggling her feet to the midline prior to being able to ambulate, and even with this can only ambulate a few feet without having severe pain.  She describes numbness across to her upper buttock region, she has no numbness or weakness in her legs and has had no urinary fecal incontinence, denies fevers or chills, denies dysuria.  There is no radiation of pain.  She has taken arthritis strength Tylenol, last dose was 2 tablets yesterday evening with no improvement in symptoms.  The history is provided by the patient.       Home Medications Prior to Admission medications   Medication Sig Start Date End Date Taking? Authorizing Provider  cephALEXin (KEFLEX) 500 MG capsule Take 1 capsule (500 mg total) by mouth 2 (two) times daily for 7 days. 11/22/22 11/29/22 Yes Tequia Wolman, Raynelle Fanning, PA-C  HYDROcodone-acetaminophen (NORCO/VICODIN) 5-325 MG tablet Take 0.5-1 tablets by mouth every 6 (six) hours as needed. 11/22/22  Yes Shanteria Laye, Raynelle Fanning, PA-C  lidocaine (LIDODERM) 5 % Place 1 patch onto the skin daily. Remove & Discard patch within 12 hours or as directed by MD 11/22/22  Yes Mamye Bolds, Raynelle Fanning, PA-C  amLODipine (NORVASC) 10 MG tablet Take 1 tablet (10 mg total) by mouth daily. 11/25/17   Simonne Martinet, NP  aspirin EC 81 MG tablet Take 81 mg by mouth daily.    [provider]  cholecalciferol (VITAMIN D) 1000 units  tablet Take 1,000 Units by mouth daily.    [provider]  Cyanocobalamin (VITAMIN B-12 PO) Take 1 tablet by mouth daily.    [provider]  levothyroxine (SYNTHROID, LEVOTHROID) 50 MCG tablet Take 50 mcg by mouth daily before breakfast.    [provider]  loratadine (CLARITIN) 10 MG tablet Take 10 mg by mouth daily as needed for allergies.     [provider]  metoprolol succinate (TOPROL-XL) 100 MG 24 hr tablet Take 100 mg by mouth daily.     [provider]      Allergies    Other    Review of Systems   Review of Systems  Constitutional:  Negative for chills and fever.  HENT:  Negative for congestion and sore throat.   Eyes: Negative.   Respiratory:  Negative for chest tightness and shortness of breath.   Cardiovascular:  Negative for chest pain.  Gastrointestinal:  Negative for abdominal pain and nausea.  Genitourinary: Negative.  Negative for dysuria and urgency.  Musculoskeletal:  Positive for back pain. Negative for arthralgias, joint swelling and neck pain.  Skin: Negative.  Negative for rash and wound.  Neurological:  Positive for numbness. Negative for dizziness, weakness, light-headedness and headaches.  Psychiatric/Behavioral: Negative.      Physical Exam Updated Vital Signs BP (!) 154/53   Pulse (!) 59   Temp 97.7 F (36.5 C) (Oral)   Resp Marland Kitchen)  22   Ht 5' (1.524 m)   Wt 45.4 kg   SpO2 96%   BMI 19.53 kg/m  Physical Exam Vitals and nursing note reviewed.  Constitutional:      Appearance: She is well-developed.  HENT:     Head: Normocephalic.  Eyes:     Conjunctiva/sclera: Conjunctivae normal.  Cardiovascular:     Rate and Rhythm: Normal rate.     Pulses: Normal pulses.     Comments: Pedal pulses normal. Pulmonary:     Effort: Pulmonary effort is normal.  Abdominal:     General: Bowel sounds are normal. There is no distension.     Palpations: Abdomen is soft. There is no mass.  Musculoskeletal:         General: Normal range of motion.     Cervical back: Normal range of motion and neck supple.     Lumbar back: Tenderness present. No swelling, edema, spasms or bony tenderness.       Back:     Comments: No midline lumbar tenderness.  She has exquisite tenderness palpation along her right posterior pelvic brim, less so across to her left pelvic rim.  Skin is intact, no rash, no erythema or induration.  Skin:    General: Skin is warm and dry.  Neurological:     Mental Status: She is alert.     Sensory: No sensory deficit.     Motor: No tremor or atrophy.     Gait: Gait normal.     Comments: No strength deficit noted in hip and knee flexor and extensor muscle groups.  Ankle flexion and extension intact.     ED Results / Procedures / Treatments   Labs (all labs ordered are listed, but only abnormal results are displayed) Labs Reviewed  URINALYSIS, ROUTINE W REFLEX MICROSCOPIC - Abnormal; Notable for the following components:      Result Value   APPearance HAZY (*)    Hgb urine dipstick SMALL (*)    Nitrite POSITIVE (*)    Leukocytes,Ua TRACE (*)    Bacteria, UA FEW (*)    All other components within normal limits    EKG None  Radiology DG Hips Bilat W or Wo Pelvis 3-4 Views  Result Date: 11/22/2022 CLINICAL DATA:  Lumbar pain radiating to the legs EXAM: DG HIP (WITH OR WITHOUT PELVIS) 3-4V BILAT COMPARISON:  None Available. FINDINGS: There is no evidence of hip fracture or dislocation. There is no evidence of arthropathy or other focal bone abnormality. IMPRESSION: Negative. Electronically Signed   By: Jasmine Pang M.D.   On: 11/22/2022 16:23   DG Lumbar Spine Complete  Result Date: 11/22/2022 CLINICAL DATA:  Pain.  No history of injury or trauma EXAM: LUMBAR SPINE - COMPLETE 5 VIEW COMPARISON:  None Available. FINDINGS: Osteopenia. Five lumbar-type vertebral bodies. Preserved vertebral body heights. Mild scattered endplate osteophytes. There is some mild disc height loss at  L3-4. Lower lumbar facet degenerative changes. No spondylolysis or listhesis. Scattered vascular calcifications. IMPRESSION: Osteopenia.  Moderate degenerative changes. Electronically Signed   By: Karen Kays M.D.   On: 11/22/2022 13:17    Procedures Procedures    Medications Ordered in ED Medications  cephALEXin (KEFLEX) capsule 500 mg (has no administration in time range)  lidocaine (LIDODERM) 5 % 1 patch (has no administration in time range)  HYDROcodone-acetaminophen (NORCO/VICODIN) 5-325 MG per tablet 1 tablet (1 tablet Oral Given 11/22/22 1359)  ondansetron (ZOFRAN-ODT) disintegrating tablet 4 mg (4 mg Oral Given 11/22/22 1400)  ED Course/ Medical Decision Making/ A&P                                 Medical Decision Making Patient presenting with mechanical low back pain, worsened with certain positions and with ambulation.  No injuries.  Has a history of significant arthritis.  Differential diagnosis also includes possibility of compression fracture given history of osteopenia.  No fevers, no risk factors for infection.  Amount and/or Complexity of Data Reviewed Labs: ordered.    Details: Urinalysis is consistent with a UTI, she is nitrite positive with leukocytosis and bacteria.  She has no symptoms of UTI, suspect her low back pain is at least partially driven by this finding.  She has no flank pain, her pain is worsened with movement, not consistent with kidney stone. Radiology: ordered.    Details: Lumbar spine reviewed and agree with interpretation, osteopenia with moderate degenerative changes.  Her bilateral hip films are unremarkable.  Risk Prescription drug management.           Final Clinical Impression(s) / ED Diagnoses Final diagnoses:  Acute bilateral low back pain without sciatica  Acute cystitis without hematuria    Rx / DC Orders ED Discharge Orders          Ordered    cephALEXin (KEFLEX) 500 MG capsule  2 times daily        11/22/22 1704     lidocaine (LIDODERM) 5 %  Every 24 hours        11/22/22 1704    HYDROcodone-acetaminophen (NORCO/VICODIN) 5-325 MG tablet  Every 6 hours PRN        11/22/22 1704              Burgess Amor, PA-C 11/22/22 1709    Sloan Leiter, DO 11/22/22 2038

## 2022-11-22 NOTE — ED Triage Notes (Signed)
Lumbar pain for years. Worse x1 week.  B/L numbness in buttocks  Denies recent falls or injuries.

## 2022-11-26 DIAGNOSIS — M47816 Spondylosis without myelopathy or radiculopathy, lumbar region: Secondary | ICD-10-CM | POA: Diagnosis not present

## 2022-11-26 DIAGNOSIS — M9903 Segmental and somatic dysfunction of lumbar region: Secondary | ICD-10-CM | POA: Diagnosis not present

## 2022-11-26 DIAGNOSIS — S338XXA Sprain of other parts of lumbar spine and pelvis, initial encounter: Secondary | ICD-10-CM | POA: Diagnosis not present

## 2022-11-27 DIAGNOSIS — M47816 Spondylosis without myelopathy or radiculopathy, lumbar region: Secondary | ICD-10-CM | POA: Diagnosis not present

## 2022-11-27 DIAGNOSIS — M9903 Segmental and somatic dysfunction of lumbar region: Secondary | ICD-10-CM | POA: Diagnosis not present

## 2022-11-27 DIAGNOSIS — S338XXA Sprain of other parts of lumbar spine and pelvis, initial encounter: Secondary | ICD-10-CM | POA: Diagnosis not present

## 2022-11-28 DIAGNOSIS — M47816 Spondylosis without myelopathy or radiculopathy, lumbar region: Secondary | ICD-10-CM | POA: Diagnosis not present

## 2022-11-28 DIAGNOSIS — S338XXA Sprain of other parts of lumbar spine and pelvis, initial encounter: Secondary | ICD-10-CM | POA: Diagnosis not present

## 2022-11-28 DIAGNOSIS — M9903 Segmental and somatic dysfunction of lumbar region: Secondary | ICD-10-CM | POA: Diagnosis not present

## 2022-12-02 DIAGNOSIS — M249 Joint derangement, unspecified: Secondary | ICD-10-CM | POA: Diagnosis not present

## 2022-12-02 DIAGNOSIS — Z6820 Body mass index (BMI) 20.0-20.9, adult: Secondary | ICD-10-CM | POA: Diagnosis not present

## 2022-12-02 DIAGNOSIS — M47816 Spondylosis without myelopathy or radiculopathy, lumbar region: Secondary | ICD-10-CM | POA: Diagnosis not present

## 2022-12-02 DIAGNOSIS — M9903 Segmental and somatic dysfunction of lumbar region: Secondary | ICD-10-CM | POA: Diagnosis not present

## 2022-12-02 DIAGNOSIS — M62830 Muscle spasm of back: Secondary | ICD-10-CM | POA: Diagnosis not present

## 2022-12-02 DIAGNOSIS — S338XXA Sprain of other parts of lumbar spine and pelvis, initial encounter: Secondary | ICD-10-CM | POA: Diagnosis not present

## 2022-12-04 DIAGNOSIS — M47816 Spondylosis without myelopathy or radiculopathy, lumbar region: Secondary | ICD-10-CM | POA: Diagnosis not present

## 2022-12-04 DIAGNOSIS — M9903 Segmental and somatic dysfunction of lumbar region: Secondary | ICD-10-CM | POA: Diagnosis not present

## 2022-12-04 DIAGNOSIS — S338XXA Sprain of other parts of lumbar spine and pelvis, initial encounter: Secondary | ICD-10-CM | POA: Diagnosis not present

## 2022-12-06 DIAGNOSIS — M9903 Segmental and somatic dysfunction of lumbar region: Secondary | ICD-10-CM | POA: Diagnosis not present

## 2022-12-06 DIAGNOSIS — M47816 Spondylosis without myelopathy or radiculopathy, lumbar region: Secondary | ICD-10-CM | POA: Diagnosis not present

## 2022-12-06 DIAGNOSIS — S338XXA Sprain of other parts of lumbar spine and pelvis, initial encounter: Secondary | ICD-10-CM | POA: Diagnosis not present

## 2022-12-09 DIAGNOSIS — M47816 Spondylosis without myelopathy or radiculopathy, lumbar region: Secondary | ICD-10-CM | POA: Diagnosis not present

## 2022-12-09 DIAGNOSIS — M9903 Segmental and somatic dysfunction of lumbar region: Secondary | ICD-10-CM | POA: Diagnosis not present

## 2022-12-09 DIAGNOSIS — S338XXA Sprain of other parts of lumbar spine and pelvis, initial encounter: Secondary | ICD-10-CM | POA: Diagnosis not present

## 2022-12-16 DIAGNOSIS — M47816 Spondylosis without myelopathy or radiculopathy, lumbar region: Secondary | ICD-10-CM | POA: Diagnosis not present

## 2022-12-16 DIAGNOSIS — S338XXA Sprain of other parts of lumbar spine and pelvis, initial encounter: Secondary | ICD-10-CM | POA: Diagnosis not present

## 2022-12-16 DIAGNOSIS — M9903 Segmental and somatic dysfunction of lumbar region: Secondary | ICD-10-CM | POA: Diagnosis not present

## 2022-12-24 DIAGNOSIS — M9903 Segmental and somatic dysfunction of lumbar region: Secondary | ICD-10-CM | POA: Diagnosis not present

## 2022-12-24 DIAGNOSIS — S338XXA Sprain of other parts of lumbar spine and pelvis, initial encounter: Secondary | ICD-10-CM | POA: Diagnosis not present

## 2022-12-24 DIAGNOSIS — M47816 Spondylosis without myelopathy or radiculopathy, lumbar region: Secondary | ICD-10-CM | POA: Diagnosis not present

## 2022-12-26 DIAGNOSIS — E7849 Other hyperlipidemia: Secondary | ICD-10-CM | POA: Diagnosis not present

## 2022-12-26 DIAGNOSIS — E559 Vitamin D deficiency, unspecified: Secondary | ICD-10-CM | POA: Diagnosis not present

## 2022-12-26 DIAGNOSIS — E039 Hypothyroidism, unspecified: Secondary | ICD-10-CM | POA: Diagnosis not present

## 2022-12-26 DIAGNOSIS — I10 Essential (primary) hypertension: Secondary | ICD-10-CM | POA: Diagnosis not present

## 2023-01-02 DIAGNOSIS — E7849 Other hyperlipidemia: Secondary | ICD-10-CM | POA: Diagnosis not present

## 2023-01-02 DIAGNOSIS — F4321 Adjustment disorder with depressed mood: Secondary | ICD-10-CM | POA: Diagnosis not present

## 2023-01-02 DIAGNOSIS — E039 Hypothyroidism, unspecified: Secondary | ICD-10-CM | POA: Diagnosis not present

## 2023-01-02 DIAGNOSIS — F430 Acute stress reaction: Secondary | ICD-10-CM | POA: Diagnosis not present

## 2023-01-02 DIAGNOSIS — Z6821 Body mass index (BMI) 21.0-21.9, adult: Secondary | ICD-10-CM | POA: Diagnosis not present

## 2023-01-13 DIAGNOSIS — M47816 Spondylosis without myelopathy or radiculopathy, lumbar region: Secondary | ICD-10-CM | POA: Diagnosis not present

## 2023-01-13 DIAGNOSIS — M9903 Segmental and somatic dysfunction of lumbar region: Secondary | ICD-10-CM | POA: Diagnosis not present

## 2023-01-13 DIAGNOSIS — S338XXA Sprain of other parts of lumbar spine and pelvis, initial encounter: Secondary | ICD-10-CM | POA: Diagnosis not present

## 2023-02-20 DIAGNOSIS — M47816 Spondylosis without myelopathy or radiculopathy, lumbar region: Secondary | ICD-10-CM | POA: Diagnosis not present

## 2023-02-20 DIAGNOSIS — M9903 Segmental and somatic dysfunction of lumbar region: Secondary | ICD-10-CM | POA: Diagnosis not present

## 2023-02-20 DIAGNOSIS — S338XXA Sprain of other parts of lumbar spine and pelvis, initial encounter: Secondary | ICD-10-CM | POA: Diagnosis not present

## 2023-03-17 DIAGNOSIS — H43823 Vitreomacular adhesion, bilateral: Secondary | ICD-10-CM | POA: Diagnosis not present

## 2023-03-17 DIAGNOSIS — Z961 Presence of intraocular lens: Secondary | ICD-10-CM | POA: Diagnosis not present

## 2023-03-20 DIAGNOSIS — S338XXA Sprain of other parts of lumbar spine and pelvis, initial encounter: Secondary | ICD-10-CM | POA: Diagnosis not present

## 2023-03-20 DIAGNOSIS — M9903 Segmental and somatic dysfunction of lumbar region: Secondary | ICD-10-CM | POA: Diagnosis not present

## 2023-03-20 DIAGNOSIS — M47816 Spondylosis without myelopathy or radiculopathy, lumbar region: Secondary | ICD-10-CM | POA: Diagnosis not present

## 2023-03-21 DIAGNOSIS — H35722 Serous detachment of retinal pigment epithelium, left eye: Secondary | ICD-10-CM | POA: Diagnosis not present

## 2023-03-21 DIAGNOSIS — H43823 Vitreomacular adhesion, bilateral: Secondary | ICD-10-CM | POA: Diagnosis not present

## 2023-03-21 DIAGNOSIS — H353221 Exudative age-related macular degeneration, left eye, with active choroidal neovascularization: Secondary | ICD-10-CM | POA: Diagnosis not present

## 2023-03-21 DIAGNOSIS — H35363 Drusen (degenerative) of macula, bilateral: Secondary | ICD-10-CM | POA: Diagnosis not present

## 2023-03-27 DIAGNOSIS — H353221 Exudative age-related macular degeneration, left eye, with active choroidal neovascularization: Secondary | ICD-10-CM | POA: Diagnosis not present

## 2023-04-03 DIAGNOSIS — F17210 Nicotine dependence, cigarettes, uncomplicated: Secondary | ICD-10-CM | POA: Diagnosis not present

## 2023-04-03 DIAGNOSIS — I10 Essential (primary) hypertension: Secondary | ICD-10-CM | POA: Diagnosis not present

## 2023-04-03 DIAGNOSIS — E7849 Other hyperlipidemia: Secondary | ICD-10-CM | POA: Diagnosis not present

## 2023-04-03 DIAGNOSIS — E782 Mixed hyperlipidemia: Secondary | ICD-10-CM | POA: Diagnosis not present

## 2023-04-09 DIAGNOSIS — I10 Essential (primary) hypertension: Secondary | ICD-10-CM | POA: Diagnosis not present

## 2023-04-09 DIAGNOSIS — E7849 Other hyperlipidemia: Secondary | ICD-10-CM | POA: Diagnosis not present

## 2023-04-09 DIAGNOSIS — E782 Mixed hyperlipidemia: Secondary | ICD-10-CM | POA: Diagnosis not present

## 2023-04-09 DIAGNOSIS — Z6821 Body mass index (BMI) 21.0-21.9, adult: Secondary | ICD-10-CM | POA: Diagnosis not present

## 2023-04-09 DIAGNOSIS — F4321 Adjustment disorder with depressed mood: Secondary | ICD-10-CM | POA: Diagnosis not present

## 2023-04-09 DIAGNOSIS — F430 Acute stress reaction: Secondary | ICD-10-CM | POA: Diagnosis not present

## 2023-04-15 DIAGNOSIS — H35372 Puckering of macula, left eye: Secondary | ICD-10-CM | POA: Diagnosis not present

## 2023-04-28 DIAGNOSIS — H353221 Exudative age-related macular degeneration, left eye, with active choroidal neovascularization: Secondary | ICD-10-CM | POA: Diagnosis not present

## 2023-05-16 DIAGNOSIS — S338XXA Sprain of other parts of lumbar spine and pelvis, initial encounter: Secondary | ICD-10-CM | POA: Diagnosis not present

## 2023-05-16 DIAGNOSIS — M9903 Segmental and somatic dysfunction of lumbar region: Secondary | ICD-10-CM | POA: Diagnosis not present

## 2023-05-16 DIAGNOSIS — M47816 Spondylosis without myelopathy or radiculopathy, lumbar region: Secondary | ICD-10-CM | POA: Diagnosis not present

## 2023-06-03 DIAGNOSIS — H353221 Exudative age-related macular degeneration, left eye, with active choroidal neovascularization: Secondary | ICD-10-CM | POA: Diagnosis not present

## 2023-06-12 DIAGNOSIS — M47816 Spondylosis without myelopathy or radiculopathy, lumbar region: Secondary | ICD-10-CM | POA: Diagnosis not present

## 2023-06-12 DIAGNOSIS — S338XXA Sprain of other parts of lumbar spine and pelvis, initial encounter: Secondary | ICD-10-CM | POA: Diagnosis not present

## 2023-06-12 DIAGNOSIS — M9903 Segmental and somatic dysfunction of lumbar region: Secondary | ICD-10-CM | POA: Diagnosis not present

## 2023-07-04 ENCOUNTER — Other Ambulatory Visit (HOSPITAL_COMMUNITY): Payer: Self-pay | Admitting: Family Medicine

## 2023-07-04 DIAGNOSIS — R6889 Other general symptoms and signs: Secondary | ICD-10-CM | POA: Diagnosis not present

## 2023-07-04 DIAGNOSIS — I10 Essential (primary) hypertension: Secondary | ICD-10-CM | POA: Diagnosis not present

## 2023-07-04 DIAGNOSIS — Z6821 Body mass index (BMI) 21.0-21.9, adult: Secondary | ICD-10-CM | POA: Diagnosis not present

## 2023-07-04 DIAGNOSIS — E782 Mixed hyperlipidemia: Secondary | ICD-10-CM | POA: Diagnosis not present

## 2023-07-04 DIAGNOSIS — M48062 Spinal stenosis, lumbar region with neurogenic claudication: Secondary | ICD-10-CM | POA: Diagnosis not present

## 2023-07-15 ENCOUNTER — Ambulatory Visit (HOSPITAL_COMMUNITY)
Admission: RE | Admit: 2023-07-15 | Discharge: 2023-07-15 | Disposition: A | Discharge status: Home / Self Care | Source: Ambulatory Visit | Attending: Family Medicine | Admitting: Family Medicine

## 2023-07-15 DIAGNOSIS — M47816 Spondylosis without myelopathy or radiculopathy, lumbar region: Secondary | ICD-10-CM | POA: Diagnosis not present

## 2023-07-15 DIAGNOSIS — M48062 Spinal stenosis, lumbar region with neurogenic claudication: Secondary | ICD-10-CM | POA: Insufficient documentation

## 2023-07-15 DIAGNOSIS — M5136 Other intervertebral disc degeneration, lumbar region with discogenic back pain only: Secondary | ICD-10-CM | POA: Diagnosis not present

## 2023-07-15 DIAGNOSIS — M4807 Spinal stenosis, lumbosacral region: Secondary | ICD-10-CM | POA: Diagnosis not present

## 2023-07-15 DIAGNOSIS — M5137 Other intervertebral disc degeneration, lumbosacral region with discogenic back pain only: Secondary | ICD-10-CM | POA: Diagnosis not present

## 2023-10-02 DIAGNOSIS — E039 Hypothyroidism, unspecified: Secondary | ICD-10-CM | POA: Diagnosis not present

## 2023-10-02 DIAGNOSIS — E559 Vitamin D deficiency, unspecified: Secondary | ICD-10-CM | POA: Diagnosis not present

## 2023-10-02 DIAGNOSIS — R0982 Postnasal drip: Secondary | ICD-10-CM | POA: Diagnosis not present

## 2023-10-02 DIAGNOSIS — I10 Essential (primary) hypertension: Secondary | ICD-10-CM | POA: Diagnosis not present

## 2023-10-02 DIAGNOSIS — F17210 Nicotine dependence, cigarettes, uncomplicated: Secondary | ICD-10-CM | POA: Diagnosis not present

## 2023-10-02 DIAGNOSIS — R059 Cough, unspecified: Secondary | ICD-10-CM | POA: Diagnosis not present

## 2023-10-02 DIAGNOSIS — D559 Anemia due to enzyme disorder, unspecified: Secondary | ICD-10-CM | POA: Diagnosis not present

## 2023-10-02 DIAGNOSIS — R0981 Nasal congestion: Secondary | ICD-10-CM | POA: Diagnosis not present

## 2023-10-02 DIAGNOSIS — R634 Abnormal weight loss: Secondary | ICD-10-CM | POA: Diagnosis not present

## 2023-10-02 DIAGNOSIS — E7849 Other hyperlipidemia: Secondary | ICD-10-CM | POA: Diagnosis not present

## 2023-10-06 DIAGNOSIS — Z1389 Encounter for screening for other disorder: Secondary | ICD-10-CM | POA: Diagnosis not present

## 2023-10-06 DIAGNOSIS — I10 Essential (primary) hypertension: Secondary | ICD-10-CM | POA: Diagnosis not present

## 2023-10-06 DIAGNOSIS — Z1331 Encounter for screening for depression: Secondary | ICD-10-CM | POA: Diagnosis not present

## 2023-10-06 DIAGNOSIS — Z6821 Body mass index (BMI) 21.0-21.9, adult: Secondary | ICD-10-CM | POA: Diagnosis not present

## 2023-10-06 DIAGNOSIS — R059 Cough, unspecified: Secondary | ICD-10-CM | POA: Diagnosis not present

## 2023-10-06 DIAGNOSIS — F17210 Nicotine dependence, cigarettes, uncomplicated: Secondary | ICD-10-CM | POA: Diagnosis not present

## 2023-10-06 DIAGNOSIS — Z0001 Encounter for general adult medical examination with abnormal findings: Secondary | ICD-10-CM | POA: Diagnosis not present

## 2023-10-06 DIAGNOSIS — E782 Mixed hyperlipidemia: Secondary | ICD-10-CM | POA: Diagnosis not present
